# Patient Record
Sex: Male | Born: 1940 | Race: White | Hispanic: No | Marital: Married | Smoking: Former smoker
Health system: Southern US, Community
[De-identification: ages and names within clinical notes are randomized; demographics above are authoritative.]

## PROBLEM LIST (undated history)

## (undated) DIAGNOSIS — I219 Acute myocardial infarction, unspecified: Secondary | ICD-10-CM

## (undated) DIAGNOSIS — K219 Gastro-esophageal reflux disease without esophagitis: Secondary | ICD-10-CM

## (undated) DIAGNOSIS — IMO0002 Reserved for concepts with insufficient information to code with codable children: Secondary | ICD-10-CM

## (undated) DIAGNOSIS — Z87442 Personal history of urinary calculi: Secondary | ICD-10-CM

## (undated) DIAGNOSIS — J439 Emphysema, unspecified: Secondary | ICD-10-CM

## (undated) DIAGNOSIS — I350 Nonrheumatic aortic (valve) stenosis: Secondary | ICD-10-CM

## (undated) DIAGNOSIS — I2510 Atherosclerotic heart disease of native coronary artery without angina pectoris: Secondary | ICD-10-CM

## (undated) DIAGNOSIS — I10 Essential (primary) hypertension: Secondary | ICD-10-CM

## (undated) DIAGNOSIS — R9430 Abnormal result of cardiovascular function study, unspecified: Secondary | ICD-10-CM

## (undated) HISTORY — DX: Reserved for concepts with insufficient information to code with codable children: IMO0002

## (undated) HISTORY — PX: APPENDECTOMY: SHX54

## (undated) HISTORY — PX: CARDIAC CATHETERIZATION: SHX172

## (undated) HISTORY — DX: Atherosclerotic heart disease of native coronary artery without angina pectoris: I25.10

## (undated) HISTORY — PX: KNEE SURGERY: SHX244

## (undated) HISTORY — PX: TONSILLECTOMY: SUR1361

## (undated) HISTORY — DX: Essential (primary) hypertension: I10

## (undated) HISTORY — DX: Abnormal result of cardiovascular function study, unspecified: R94.30

## (undated) HISTORY — DX: Emphysema, unspecified: J43.9

## (undated) HISTORY — DX: Nonrheumatic aortic (valve) stenosis: I35.0

---

## 1999-01-29 ENCOUNTER — Encounter: Payer: Self-pay | Admitting: Emergency Medicine

## 1999-01-29 ENCOUNTER — Emergency Department (HOSPITAL_COMMUNITY): Admission: EM | Admit: 1999-01-29 | Discharge: 1999-01-29 | Payer: Self-pay | Admitting: Emergency Medicine

## 1999-01-30 ENCOUNTER — Encounter: Payer: Self-pay | Admitting: Urology

## 1999-01-30 ENCOUNTER — Ambulatory Visit (HOSPITAL_COMMUNITY): Admission: RE | Admit: 1999-01-30 | Discharge: 1999-01-30 | Payer: Self-pay | Admitting: Urology

## 1999-03-17 ENCOUNTER — Ambulatory Visit (HOSPITAL_COMMUNITY): Admission: RE | Admit: 1999-03-17 | Discharge: 1999-03-17 | Payer: Self-pay | Admitting: Urology

## 1999-03-17 ENCOUNTER — Encounter: Payer: Self-pay | Admitting: Urology

## 1999-03-25 ENCOUNTER — Encounter: Payer: Self-pay | Admitting: Urology

## 1999-03-25 ENCOUNTER — Ambulatory Visit (HOSPITAL_COMMUNITY): Admission: RE | Admit: 1999-03-25 | Discharge: 1999-03-25 | Payer: Self-pay | Admitting: Urology

## 1999-06-06 ENCOUNTER — Ambulatory Visit (HOSPITAL_COMMUNITY): Admission: RE | Admit: 1999-06-06 | Discharge: 1999-06-06 | Payer: Self-pay | Admitting: Urology

## 2000-03-25 ENCOUNTER — Emergency Department (HOSPITAL_COMMUNITY): Admission: EM | Admit: 2000-03-25 | Discharge: 2000-03-25 | Payer: Self-pay | Admitting: Emergency Medicine

## 2000-04-05 ENCOUNTER — Encounter: Payer: Self-pay | Admitting: Urology

## 2000-04-05 ENCOUNTER — Ambulatory Visit (HOSPITAL_COMMUNITY): Admission: RE | Admit: 2000-04-05 | Discharge: 2000-04-05 | Payer: Self-pay | Admitting: Urology

## 2000-04-16 ENCOUNTER — Ambulatory Visit (HOSPITAL_COMMUNITY): Admission: RE | Admit: 2000-04-16 | Discharge: 2000-04-16 | Payer: Self-pay | Admitting: Urology

## 2001-02-21 ENCOUNTER — Emergency Department (HOSPITAL_COMMUNITY): Admission: EM | Admit: 2001-02-21 | Discharge: 2001-02-22 | Payer: Self-pay | Admitting: Emergency Medicine

## 2003-12-06 ENCOUNTER — Ambulatory Visit (HOSPITAL_COMMUNITY): Admission: RE | Admit: 2003-12-06 | Discharge: 2003-12-06 | Payer: Self-pay | Admitting: Urology

## 2005-03-23 ENCOUNTER — Encounter: Payer: Self-pay | Admitting: Cardiology

## 2006-09-17 ENCOUNTER — Ambulatory Visit: Payer: Self-pay | Admitting: Unknown Physician Specialty

## 2007-01-03 ENCOUNTER — Encounter: Payer: Self-pay | Admitting: Cardiology

## 2007-01-21 ENCOUNTER — Encounter: Payer: Self-pay | Admitting: Cardiology

## 2007-02-01 ENCOUNTER — Encounter: Payer: Self-pay | Admitting: Cardiology

## 2007-02-18 ENCOUNTER — Encounter: Payer: Self-pay | Admitting: Cardiology

## 2008-02-08 ENCOUNTER — Encounter: Payer: Self-pay | Admitting: Cardiology

## 2008-06-15 ENCOUNTER — Encounter: Payer: Self-pay | Admitting: Cardiology

## 2008-10-12 ENCOUNTER — Encounter: Payer: Self-pay | Admitting: Cardiology

## 2008-11-22 ENCOUNTER — Ambulatory Visit: Payer: Self-pay | Admitting: Cardiology

## 2008-11-22 DIAGNOSIS — I359 Nonrheumatic aortic valve disorder, unspecified: Secondary | ICD-10-CM | POA: Insufficient documentation

## 2008-11-22 DIAGNOSIS — E669 Obesity, unspecified: Secondary | ICD-10-CM | POA: Insufficient documentation

## 2008-11-22 DIAGNOSIS — I10 Essential (primary) hypertension: Secondary | ICD-10-CM

## 2008-11-22 DIAGNOSIS — I5022 Chronic systolic (congestive) heart failure: Secondary | ICD-10-CM

## 2008-12-05 ENCOUNTER — Encounter: Payer: Self-pay | Admitting: Cardiology

## 2008-12-05 ENCOUNTER — Ambulatory Visit: Payer: Self-pay

## 2009-02-14 ENCOUNTER — Ambulatory Visit: Payer: Self-pay | Admitting: Cardiology

## 2009-03-04 ENCOUNTER — Ambulatory Visit: Payer: Self-pay | Admitting: Cardiology

## 2009-03-04 LAB — CONVERTED CEMR LAB
Basophils Absolute: 0 10*3/uL (ref 0.0–0.1)
Calcium: 9.5 mg/dL (ref 8.4–10.5)
Eosinophils Relative: 3.2 % (ref 0.0–5.0)
GFR calc non Af Amer: 71.11 mL/min (ref >60)
Glucose, Bld: 153 mg/dL — ABNORMAL HIGH (ref 70–99)
HCT: 37.3 % — ABNORMAL LOW (ref 39.0–52.0)
Hemoglobin: 13 g/dL (ref 13.0–17.0)
INR: 1 (ref 0.8–1.0)
Lymphocytes Relative: 36.2 % (ref 12.0–46.0)
Lymphs Abs: 2.7 10*3/uL (ref 0.7–4.0)
Monocytes Relative: 8.2 % (ref 3.0–12.0)
Neutro Abs: 4 10*3/uL (ref 1.4–7.7)
Platelets: 202 10*3/uL (ref 150.0–400.0)
Potassium: 4.2 meq/L (ref 3.5–5.1)
RDW: 12.7 % (ref 11.5–14.6)
Sodium: 142 meq/L (ref 135–145)
WBC: 7.5 10*3/uL (ref 4.5–10.5)
aPTT: 29.3 s — ABNORMAL HIGH (ref 21.7–28.8)

## 2009-03-07 ENCOUNTER — Ambulatory Visit: Payer: Self-pay | Admitting: Cardiology

## 2009-03-07 ENCOUNTER — Inpatient Hospital Stay (HOSPITAL_BASED_OUTPATIENT_CLINIC_OR_DEPARTMENT_OTHER): Admission: RE | Admit: 2009-03-07 | Discharge: 2009-03-07 | Payer: Self-pay | Admitting: Cardiology

## 2009-04-03 ENCOUNTER — Encounter: Payer: Self-pay | Admitting: Cardiology

## 2009-04-08 ENCOUNTER — Ambulatory Visit: Payer: Self-pay | Admitting: Cardiology

## 2009-05-28 ENCOUNTER — Ambulatory Visit: Payer: Self-pay | Admitting: Cardiology

## 2009-10-31 ENCOUNTER — Ambulatory Visit: Payer: Self-pay | Admitting: Cardiology

## 2010-02-20 ENCOUNTER — Ambulatory Visit: Payer: Self-pay | Admitting: Unknown Physician Specialty

## 2010-05-24 ENCOUNTER — Inpatient Hospital Stay (HOSPITAL_COMMUNITY)
Admission: EM | Admit: 2010-05-24 | Discharge: 2010-05-27 | Payer: Self-pay | Source: Home / Self Care | Attending: Internal Medicine | Admitting: Internal Medicine

## 2010-05-25 ENCOUNTER — Encounter: Payer: Self-pay | Admitting: Cardiology

## 2010-05-26 LAB — CARDIAC PANEL(CRET KIN+CKTOT+MB+TROPI)
CK, MB: 4.9 ng/mL — ABNORMAL HIGH (ref 0.3–4.0)
CK, MB: 6.9 ng/mL (ref 0.3–4.0)
CK, MB: 7.8 ng/mL (ref 0.3–4.0)
CK, MB: 8.5 ng/mL (ref 0.3–4.0)
Relative Index: INVALID (ref 0.0–2.5)
Relative Index: INVALID (ref 0.0–2.5)
Relative Index: 7.6 — ABNORMAL HIGH (ref 0.0–2.5)
Relative Index: INVALID (ref 0.0–2.5)
Total CK: 86 U/L (ref 7–232)
Total CK: 95 U/L (ref 7–232)
Total CK: 102 U/L (ref 7–232)
Total CK: 96 U/L (ref 7–232)
Troponin I: 0.24 ng/mL — ABNORMAL HIGH (ref 0.00–0.06)
Troponin I: 0.57 ng/mL (ref 0.00–0.06)
Troponin I: 1.55 ng/mL (ref 0.00–0.06)
Troponin I: 2 ng/mL (ref 0.00–0.06)

## 2010-05-26 LAB — HEMOGLOBIN A1C
Hgb A1c MFr Bld: 6.7 % — ABNORMAL HIGH (ref <5.7)
Mean Plasma Glucose: 146 mg/dL — ABNORMAL HIGH (ref <117)

## 2010-05-26 LAB — BASIC METABOLIC PANEL
BUN: 30 mg/dL — ABNORMAL HIGH (ref 6–23)
CO2: 25 mEq/L (ref 19–32)
Calcium: 10.1 mg/dL (ref 8.4–10.5)
Chloride: 101 mEq/L (ref 96–112)
Creatinine, Ser: 1.11 mg/dL (ref 0.4–1.5)
GFR calc Af Amer: >60 mL/min (ref >60)
GFR calc non Af Amer: >60 mL/min (ref >60)
Glucose, Bld: 263 mg/dL — ABNORMAL HIGH (ref 70–99)
Potassium: 4.5 mEq/L (ref 3.5–5.1)
Sodium: 138 mEq/L (ref 135–145)

## 2010-05-26 LAB — CBC
HCT: 38.9 % — ABNORMAL LOW (ref 39.0–52.0)
HCT: 37.8 % — ABNORMAL LOW (ref 39.0–52.0)
Hemoglobin: 12.8 g/dL — ABNORMAL LOW (ref 13.0–17.0)
Hemoglobin: 12.5 g/dL — ABNORMAL LOW (ref 13.0–17.0)
MCH: 30.2 pg (ref 26.0–34.0)
MCH: 30.4 pg (ref 26.0–34.0)
MCHC: 32.9 g/dL (ref 30.0–36.0)
MCHC: 33.1 g/dL (ref 30.0–36.0)
MCV: 91.7 fL (ref 78.0–100.0)
MCV: 92 fL (ref 78.0–100.0)
Platelets: 235 10*3/uL (ref 150–400)
Platelets: 229 10*3/uL (ref 150–400)
RBC: 4.24 MIL/uL (ref 4.22–5.81)
RBC: 4.11 MIL/uL — ABNORMAL LOW (ref 4.22–5.81)
RDW: 13 % (ref 11.5–15.5)
RDW: 12.9 % (ref 11.5–15.5)
WBC: 9.2 10*3/uL (ref 4.0–10.5)
WBC: 10.6 10*3/uL — ABNORMAL HIGH (ref 4.0–10.5)

## 2010-05-26 LAB — GLUCOSE, CAPILLARY
Glucose-Capillary: 234 mg/dL — ABNORMAL HIGH (ref 70–99)
Glucose-Capillary: 188 mg/dL — ABNORMAL HIGH (ref 70–99)
Glucose-Capillary: 168 mg/dL — ABNORMAL HIGH (ref 70–99)
Glucose-Capillary: 129 mg/dL — ABNORMAL HIGH (ref 70–99)
Glucose-Capillary: 142 mg/dL — ABNORMAL HIGH (ref 70–99)
Glucose-Capillary: 126 mg/dL — ABNORMAL HIGH (ref 70–99)
Glucose-Capillary: 186 mg/dL — ABNORMAL HIGH (ref 70–99)

## 2010-05-26 LAB — DIFFERENTIAL
Basophils Absolute: 0 10*3/uL (ref 0.0–0.1)
Basophils Relative: 0 % (ref 0–1)
Eosinophils Absolute: 0.1 10*3/uL (ref 0.0–0.7)
Eosinophils Relative: 1 % (ref 0–5)
Lymphocytes Relative: 20 % (ref 12–46)
Lymphs Abs: 1.9 10*3/uL (ref 0.7–4.0)
Monocytes Absolute: 0.6 10*3/uL (ref 0.1–1.0)
Monocytes Relative: 7 % (ref 3–12)
Neutro Abs: 6.6 10*3/uL (ref 1.7–7.7)
Neutrophils Relative %: 72 % (ref 43–77)

## 2010-05-26 LAB — CK TOTAL AND CKMB (NOT AT ARMC)
CK, MB: 2.7 ng/mL (ref 0.3–4.0)
Relative Index: INVALID (ref 0.0–2.5)
Total CK: 83 U/L (ref 7–232)

## 2010-05-26 LAB — COMPREHENSIVE METABOLIC PANEL
ALT: 33 U/L (ref 0–53)
AST: 38 U/L — ABNORMAL HIGH (ref 0–37)
Albumin: 3.3 g/dL — ABNORMAL LOW (ref 3.5–5.2)
Alkaline Phosphatase: 44 U/L (ref 39–117)
BUN: 22 mg/dL (ref 6–23)
CO2: 29 mEq/L (ref 19–32)
Calcium: 9.9 mg/dL (ref 8.4–10.5)
Chloride: 104 mEq/L (ref 96–112)
Creatinine, Ser: 1.05 mg/dL (ref 0.4–1.5)
GFR calc Af Amer: >60 mL/min (ref >60)
GFR calc non Af Amer: >60 mL/min (ref >60)
Glucose, Bld: 162 mg/dL — ABNORMAL HIGH (ref 70–99)
Potassium: 4.1 mEq/L (ref 3.5–5.1)
Sodium: 140 mEq/L (ref 135–145)
Total Bilirubin: 0.4 mg/dL (ref 0.3–1.2)
Total Protein: 6.4 g/dL (ref 6.0–8.3)

## 2010-05-26 LAB — TSH: TSH: 0.955 u[IU]/mL (ref 0.350–4.500)

## 2010-05-26 LAB — HEPARIN LEVEL (UNFRACTIONATED)
Heparin Unfractionated: <0.1 IU/mL — ABNORMAL LOW (ref 0.30–0.70)
Heparin Unfractionated: 0.16 IU/mL — ABNORMAL LOW (ref 0.30–0.70)

## 2010-05-26 LAB — POCT CARDIAC MARKERS
CKMB, poc: 3.1 ng/mL (ref 1.0–8.0)
Myoglobin, poc: 128 ng/mL (ref 12–200)
Troponin i, poc: <0.05 ng/mL (ref 0.00–0.09)

## 2010-05-26 LAB — LIPID PANEL
Cholesterol: 147 mg/dL (ref 0–200)
HDL: 29 mg/dL — ABNORMAL LOW (ref >39)
LDL Cholesterol: 74 mg/dL (ref 0–99)
Total CHOL/HDL Ratio: 5.1 RATIO
Triglycerides: 221 mg/dL — ABNORMAL HIGH (ref <150)
VLDL: 44 mg/dL — ABNORMAL HIGH (ref 0–40)

## 2010-05-26 LAB — TROPONIN I: Troponin I: 0.08 ng/mL — ABNORMAL HIGH (ref 0.00–0.06)

## 2010-05-26 LAB — PROTIME-INR
INR: 0.94 (ref 0.00–1.49)
Prothrombin Time: 12.8 seconds (ref 11.6–15.2)

## 2010-05-26 LAB — BRAIN NATRIURETIC PEPTIDE: Pro B Natriuretic peptide (BNP): 119 pg/mL — ABNORMAL HIGH (ref 0.0–100.0)

## 2010-05-26 LAB — D-DIMER, QUANTITATIVE: D-Dimer, Quant: 0.42 ug/mL-FEU (ref 0.00–0.48)

## 2010-05-27 ENCOUNTER — Telehealth: Payer: Self-pay | Admitting: Cardiology

## 2010-05-28 LAB — GLUCOSE, CAPILLARY
Glucose-Capillary: 171 mg/dL — ABNORMAL HIGH (ref 70–99)
Glucose-Capillary: 145 mg/dL — ABNORMAL HIGH (ref 70–99)
Glucose-Capillary: 132 mg/dL — ABNORMAL HIGH (ref 70–99)
Glucose-Capillary: 146 mg/dL — ABNORMAL HIGH (ref 70–99)
Glucose-Capillary: 167 mg/dL — ABNORMAL HIGH (ref 70–99)
Glucose-Capillary: 190 mg/dL — ABNORMAL HIGH (ref 70–99)
Glucose-Capillary: 152 mg/dL — ABNORMAL HIGH (ref 70–99)

## 2010-05-28 LAB — CBC
HCT: 36.7 % — ABNORMAL LOW (ref 39.0–52.0)
HCT: 36.9 % — ABNORMAL LOW (ref 39.0–52.0)
HCT: 36 % — ABNORMAL LOW (ref 39.0–52.0)
Hemoglobin: 12.1 g/dL — ABNORMAL LOW (ref 13.0–17.0)
Hemoglobin: 12.3 g/dL — ABNORMAL LOW (ref 13.0–17.0)
Hemoglobin: 11.8 g/dL — ABNORMAL LOW (ref 13.0–17.0)
MCH: 30 pg (ref 26.0–34.0)
MCH: 30.2 pg (ref 26.0–34.0)
MCH: 29.8 pg (ref 26.0–34.0)
MCHC: 33 g/dL (ref 30.0–36.0)
MCHC: 33.3 g/dL (ref 30.0–36.0)
MCHC: 32.8 g/dL (ref 30.0–36.0)
MCV: 90.7 fL (ref 78.0–100.0)
MCV: 90.8 fL (ref 78.0–100.0)
MCV: 90.9 fL (ref 78.0–100.0)
Platelets: 195 10*3/uL (ref 150–400)
Platelets: 210 10*3/uL (ref 150–400)
Platelets: 179 10*3/uL (ref 150–400)
RBC: 4.04 MIL/uL — ABNORMAL LOW (ref 4.22–5.81)
RBC: 4.07 MIL/uL — ABNORMAL LOW (ref 4.22–5.81)
RBC: 3.96 MIL/uL — ABNORMAL LOW (ref 4.22–5.81)
RDW: 12.9 % (ref 11.5–15.5)
RDW: 13 % (ref 11.5–15.5)
RDW: 12.9 % (ref 11.5–15.5)
WBC: 8.8 10*3/uL (ref 4.0–10.5)
WBC: 9.6 10*3/uL (ref 4.0–10.5)
WBC: 8.4 10*3/uL (ref 4.0–10.5)

## 2010-05-28 LAB — BASIC METABOLIC PANEL
BUN: 22 mg/dL (ref 6–23)
BUN: 19 mg/dL (ref 6–23)
CO2: 28 mEq/L (ref 19–32)
CO2: 29 mEq/L (ref 19–32)
Calcium: 9.7 mg/dL (ref 8.4–10.5)
Calcium: 9.6 mg/dL (ref 8.4–10.5)
Chloride: 101 mEq/L (ref 96–112)
Chloride: 104 mEq/L (ref 96–112)
Creatinine, Ser: 1.01 mg/dL (ref 0.4–1.5)
Creatinine, Ser: 1.19 mg/dL (ref 0.4–1.5)
GFR calc Af Amer: >60 mL/min (ref >60)
GFR calc Af Amer: >60 mL/min (ref >60)
GFR calc non Af Amer: >60 mL/min (ref >60)
GFR calc non Af Amer: >60 mL/min (ref >60)
Glucose, Bld: 180 mg/dL — ABNORMAL HIGH (ref 70–99)
Glucose, Bld: 154 mg/dL — ABNORMAL HIGH (ref 70–99)
Potassium: 3.8 mEq/L (ref 3.5–5.1)
Potassium: 4 mEq/L (ref 3.5–5.1)
Sodium: 136 mEq/L (ref 135–145)
Sodium: 138 mEq/L (ref 135–145)

## 2010-05-28 LAB — DIFFERENTIAL
Basophils Absolute: 0 10*3/uL (ref 0.0–0.1)
Basophils Absolute: 0 10*3/uL (ref 0.0–0.1)
Basophils Relative: 0 % (ref 0–1)
Basophils Relative: 0 % (ref 0–1)
Eosinophils Absolute: 0.3 10*3/uL (ref 0.0–0.7)
Eosinophils Absolute: 0.3 10*3/uL (ref 0.0–0.7)
Eosinophils Relative: 3 % (ref 0–5)
Eosinophils Relative: 3 % (ref 0–5)
Lymphocytes Relative: 43 % (ref 12–46)
Lymphocytes Relative: 34 % (ref 12–46)
Lymphs Abs: 4.1 10*3/uL — ABNORMAL HIGH (ref 0.7–4.0)
Lymphs Abs: 2.9 10*3/uL (ref 0.7–4.0)
Monocytes Absolute: 0.9 10*3/uL (ref 0.1–1.0)
Monocytes Absolute: 0.9 10*3/uL (ref 0.1–1.0)
Monocytes Relative: 9 % (ref 3–12)
Monocytes Relative: 11 % (ref 3–12)
Neutro Abs: 4.3 10*3/uL (ref 1.7–7.7)
Neutro Abs: 4.3 10*3/uL (ref 1.7–7.7)
Neutrophils Relative %: 45 % (ref 43–77)
Neutrophils Relative %: 52 % (ref 43–77)

## 2010-05-28 LAB — PHOSPHORUS
Phosphorus: 3.7 mg/dL (ref 2.3–4.6)
Phosphorus: 3.6 mg/dL (ref 2.3–4.6)

## 2010-05-28 LAB — HEPARIN LEVEL (UNFRACTIONATED): Heparin Unfractionated: 0.43 IU/mL (ref 0.30–0.70)

## 2010-05-28 LAB — MAGNESIUM
Magnesium: 2 mg/dL (ref 1.5–2.5)
Magnesium: 2.1 mg/dL (ref 1.5–2.5)

## 2010-06-04 NOTE — Discharge Summary (Addendum)
NAMEBAYAN, KUSHNIR               ACCOUNT NO.:  [id number]  MEDICAL RECORD NO.:  [id number]          PATIENT TYPE:  INP  LOCATION:  6531                         FACILITY:  [facility]  PHYSICIAN:  Rock Nephew, MD       DATE OF BIRTH:  06-26-1940  DATE OF ADMISSION:  05/24/2010 DATE OF DISCHARGE:  05/27/2010                        DISCHARGE SUMMARY - REFERRING   DISCHARGE DIAGNOSES: 1. Non-ST segment elevation myocardial infarction status post cardiac     catheterization with drug-eluting stent to the right coronary     artery. 2. Systolic congestive heart failure, chronic, ejection fraction of     35%. 3. Type 2 diabetes mellitus. 4. History of gastroesophageal reflux disease. 5. History of hyperlipidemia. 6. Cough and bronchitis. 7. Also, history of hypertension. 8. History of kidney stones. 9. History of gouty arthritis. 10.Dilated aortic root.  DISCHARGE MEDICATIONS: 1. Aspirin enteric-coated 325 mg p.o. daily. 2. Carvedilol 18.75 mg p.o. b.i.d. 3. Guaifenesin 600 mg p.o. b.i.d. 4. Effient 10 mg p.o. daily. 5. Allopurinol 300 mg p.o. daily. 6. Felodipine 10 mg p.o. daily. 7. Fenofibrate 160 mg p.o. daily. 8. Hydrocodone/chlorpheniramine oral suspension 1 teaspoon p.o. q.12h.     p.r.n. for cough. 9. Levofloxacin 500 mg, one tablet p.o. daily for three days. 10.Lisinopril/hydrochlorothiazide 20/12.5 mg p.o. daily. 11.Metformin XR 500 mg, take two tablets in the morning, one tablet in     the evening meal. 12.Omeprazole 40 mg p.o. daily. 13.Pravastatin 40 mg p.o. daily at bedtime. 14.Spironolactone 25 mg p.o. daily.  DISPOSITION:  The patient is discharged home.  DIET:  The patient's diet is carbohydrate-modified with a 2-liter fluid restriction.  FOLLOW-UP:  The patient should follow up with the primary care physician, Dr. Einar Crow, at [facility] in [place] in one to two weeks.  The patient should follow up with Dr. Rollene Rotunda on June 12, 2010 at 4:15 p.m.  CONSULTATIONS:  [facility], Dr. Rollene Rotunda as well as Dr. Tonny Bollman.  PROCEDURES PERFORMED DURING THIS ADMISSION:  The patient had a chest x- ray which showed bronchitic and questionable emphysematous changes.  No acute abnormalities.  The patient also had a 2-D echocardiogram which showed a left ventricle ejection fraction of 35%, moderate diffuse hypokinesias with some regionality.  The inferior wall and apical septal segments looked worse.  Doppler parameters were consistent with abnormal left ventricular relaxation, grade 1 diastolic dysfunction, aortic valve probably trileaflet, no stenosis, dilated aortic root measuring 46 mm.  The patient also had a cardiac catheterization which showed: 1. There was subtotal occlusion of the mid left circumflex treated     successfully with percutaneous coronary intervention with a 3 x 16     mm drug-eluting stent.  Percent stenosis was taken from 95% to 0%     with TIMI II flow taken to TIMI III flow post percutaneous coronary     intervention. 2. Moderate diffuse left anterior descending stenosis. 3. Diffuse nonobstructive right coronary artery stenosis.  The patient will remain on aspirin and Effient for greater than or equal to 12 months.  INITIAL HISTORY AND PHYSICAL/CHIEF COMPLAINT:  Chest pain.  HISTORY OF  PRESENT ILLNESS:  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  The patient reports he came in with chest pain.  He developed symptoms of upper respiratory infection about five days ago.  Then, the patient had chest pain and he came in to the hospital and the patient's troponins were elevated initially to around 0.2, but they reached a peak of about 2.  [facility] was consulted.  HOSPITAL COURSE: 1. NSTEMI.  The patient was developing an NSTEMI.  The patient was     placed on a  heparin drip.  The patient was seen by [facility].  The patient was taken to the Cardiac Cath Lab on     May 26, 2010 and the patient had the percutaneous coronary     intervention to the right coronary artery.  The patient remained     chest pain-free.  Dr. Antoine Poche saw the patient the next day.  He     recommended the patient stay on Effient and 325 mg of enteric-     coated aspirin for at least 12 months.  The patient should follow     up with Dr. Antoine Poche as an outpatient. 2. Systolic CHF, chronic.  The patient has systolic CHF, chronic.  It     is unclear if the patient has nonischemic or ischemic     cardiomyopathy.  Ischemic cardiomyopathy is possible since the     patient had coronary artery disease with stenosis.  The patient has     an ejection fraction of 35%.  The patient is discharged home on     both Aldactone as well as lisinopril.  The patient is also on     Coreg.  The Coreg dose has been increased to 18.75 mg b.i.d. 3. Type 2 diabetes mellitus.  The patient will be discharged home on     metformin as he takes at home. 4. GERD.  The patient takes a PPI.  He will continue that at home. 5. Hyperlipidemia.  The patient takes a statin as well as a fibrate     and he will continue that as an outpatient. 6. Cough, bronchitis.  The patient will receive Levaquin as well as     Mucinex for three more days. 7. Hypertension.  The patient takes multiple antihypertensives     including lisinopril/hydrochlorothiazide combination pill,     Aldactone, Coreg and felodipine. 8. DVT prophylaxis.  The patient initially received a heparin drip     then enoxaparin for DVT prophylaxis.  I spoke with Dr. Excell Seltzer on     May 27, 2010.  He told me that the patient should be okay for     discharge.  Please note that this is not an official document until electronically signed.     Rock Nephew, MD     NH/MEDQ  D:  05/27/2010  T:  05/27/2010  Job:  [id number]  cc:    [facility] Dr. Jarvis Morgan, MD, [facility]. Excell Seltzer, MD  Electronically Signed by Rock Nephew MD on 06/04/2010 06:25:17 PM

## 2010-06-05 NOTE — Consult Note (Addendum)
NAMEORLANDO, DEVEREUX NO.:  [id number]  MEDICAL RECORD NO.:  [id number]          PATIENT TYPE:  INP  LOCATION:  3708                         FACILITY:  [facility]  PHYSICIAN:  Rollene Rotunda, MD, FACCDATE OF BIRTH:  [birth year]  DATE OF CONSULTATION:  05/24/2010 DATE OF DISCHARGE:                                CONSULTATION   PRIMARY CARE PHYSICIAN:  Einar Crow.  CARDIOLOGIST:  Dr. Antoine Poche.  REASON FOR PRESENTATION:  Evaluate the patient with chest pain.  HISTORY OF PRESENT ILLNESS:  The patient is a pleasant 70 year old gentleman of mine.  He has a history of some nonobstructive coronary disease as described below.  He has had a mildly reduced ejection fraction in the past.  There has been a vague history of bicuspid aortic valve that I could not confirm on his most recent echo.  He had been getting along well until he developed what he thought were upper respiratory symptoms mid last week.  He saw his primary MD and was started on antibiotics and prednisone.  He said that on Wednesday following this he developed some shoulder and arm discomfort.  He had some radiation into his left shoulder blade.  He said he subsequently had radiation yesterday into his chest.  It was 8/10 in intensity.  It felt like an aching 18 discomfort.  It would last 3-4 minutes that time. He did get somewhat diaphoretic.  He did not have any nausea, vomiting or shortness of breath.  He did not have not any exacerbation with movement or deep breathing.  This was happening at rest.  He could not make it come on.  He never had this before.  He presented to the emergency room where he had no acute EKG changes.  CK was normal. However, troponin was very slightly elevated.  However, this evening it increased to 0.57.  His pain was not relieved with nitroglycerin in the emergency room but did go away with morphine.  He otherwise had been feeling well prior to what he felt was his  URI.  PAST HISTORY:  Hypertension, diabetes mellitus, cardiomyopathy (EF 35- 40% by echo 11/2008, 50% by catheterization), questionable bicuspid aortic valve without gradient (not clearly evident on the most recent echo), nonobstructive coronary disease (catheterization October 2010 with LAD 50% stenosis and scattered 25% lesions.)  PAST SURGICAL HISTORY:  Tonsillectomy, appendectomy, knee surgery in 1993 and 2006, cystourethroscopy.  FAMILY HISTORY:  Noncontributory for early coronary disease.  Father had the onset of coronary disease at a later age.  SOCIAL HISTORY:  The patient smoked 1 pack per day for 15 years quitting 30 years ago.  He is married with 1 child.  ALLERGIES:  None.  MEDICATIONS:  Hydrocodone, prednisone, levofloxacin, pravastatin 40 mg q.h.s., aspirin 81 mg daily, omeprazole 40 mg daily, felodipine 10 mg daily, allopurinol, lisinopril HCT 20/12.5, spironolactone 25 mg daily, fenofibrate 160 mg daily, carvedilol 12-1/2 mg b.i.d., metformin 500 mg b.i.d.  REVIEW OF SYSTEMS:  As stated in the HPI, otherwise negative for all other systems.  PHYSICAL EXAMINATION:  The patient is pleasant and in no distress. Blood pressure 156/81, heart rate  72 and regular, afebrile, respiratory rate 20, 98% saturation on room air. HEENT:  Eyelids are unremarkable, pupils equal, round and reactive to light, fundi not visualized, oral mucosa normal. NECK:  No jugular distention at 45 degrees.  Carotid upstroke brisk and symmetric, no bruits, no thyromegaly. LYMPHATICS:  No cervical, axillary or inguinal adenopathy. LUNGS:  Clear to auscultation bilaterally. BACK:  No costovertebral tenderness. CHEST:  Unremarkable. HEART:  PMI not displaced or sustained, S1-S2 within normal limits, no S3, S4, no clicks, rubs, murmurs. ABDOMEN:  Flat, positive bowel sounds that are normal frequency and pitch, no bruits, rebound, guarding or midline pulsatile mass, no hepatomegaly,  splenomegaly. SKIN:  No rashes, no nodules. EXTREMITIES:  2+ pulses, no edema, cyanosis or clubbing. NEURO:  Oriented to person place, and time, cranial nerves II-XII grossly intact, motor grossly intact.  EKG:  Sinus rhythm, left axis deviation, no acute ST-wave changes.  LABORATORY DATA:  TSH 0.955, hemoglobin A1c 6.7, CK peak 95, MB 6.9, troponin 0.57, BNP 119, D-dimer within normal limits, sodium 138, potassium 4.5, BUN 30, creatinine 1.11, WBC 9.2, hemoglobin 12.8, platelets 235.  Chest x-ray:  Questionable bronchitic changes.  ASSESSMENT/PLAN: 1. Elevated troponin.  The patient appears to have had non-Q-wave     myocardial infarction.  He will be switched from DVT Lovenox to     therapeutic heparin.  He will be continued on his aspirin and his     beta blocker.  We can use nitrates if he has any recurrent pain.     He will have elective cardiac catheterization on Monday. 2. Cardiomyopathy.  He has had a mildly reduced ejection fraction.  He     bas class I symptoms at this point.  I will repeat an     echocardiogram while he is here. 3. Diabetes per the primary team. 4. Risk reduction.  He should have a lipid profile with a goal LDL     less than 70 and HDL greater than 50.     Rollene Rotunda, MD, [facility]     JH/MEDQ  D:  05/24/2010  T:  05/24/2010  Job:  [id number]  Electronically Signed by Rollene Rotunda MD [facility] on 06/05/2010 12:29:23 PM

## 2010-06-10 NOTE — Assessment & Plan Note (Signed)
Summary: 1 MONTH ROV/   Visit Type:  Follow-up Primary Provider:  Einar Crow, MD  CC:  Cardiomyopathy.  History of Present Illness: The patient presents for followup of this. I titrated his carvedilol at the last appointment. He did well with this. He had no lightheadedness, presyncope or syncope. He's had no chest pain or shortness of breath. He's had no swelling or weight gain. He is up to walking 30 minutes most days of the week.  Current Medications (verified): 1)  Metformin Hcl 500 Mg Tabs (Metformin Hcl) .... 2 Qam and 1 Qpm 2)  Spironolactone 25 Mg Tabs (Spironolactone) .... Take One Tablet By Mouth Daily 3)  Felodipine 10 Mg Xr24h-Tab (Felodipine) .... Once Daily 4)  Omeprazole 40 Mg Cpdr (Omeprazole) .... Once Daily 5)  Allopurinol 300 Mg Tabs (Allopurinol) .... Once Daily 6)  Fenofibrate 160 Mg Tabs (Fenofibrate) .... Take One Tablet By Mouth Daily With A Meal 7)  Carvedilol 12.5 Mg Tabs (Carvedilol) .... Two Tabs Twice Daily 8)  Lisinopril-Hydrochlorothiazide 20-12.5 Mg Tabs (Lisinopril-Hydrochlorothiazide) .... Once Daily 9)  Aspirin 81 Mg Tbec (Aspirin) .... Take One Tablet By Mouth Daily 10)  Multivitamins   Tabs (Multiple Vitamin) .... Once Daily  Allergies (verified): No Known Drug Allergies  Past History:  Past Medical History: Hypertension x8 years Diabetes mellitus (recent diagnosis) Aortic stenosis (apparent bicuspid aortic valve) Mildly  to moderately reduced ejection fraction  (EF approximately 40%) Nonobstructive coronary disease  Past Surgical History: Reviewed history from 11/22/2008 and no changes required. Tonsillectomy Appendectomy Knee surgery in 1993 and in 2006  Review of Systems       As stated in the HPI and negative for all other systems.   Vital Signs:  Patient profile:   70 year old male Height:      74 inches Weight:      222 pounds BMI:     28.61 Pulse rate:   83 / minute BP sitting:   118 / 72  (left arm)  Vitals  Entered By: Oswald Hillock (May 28, 2009 4:32 PM)  Physical Exam  General:  Well developed, well nourished, in no acute distress. Head:  normocephalic and atraumatic Eyes:  PERRLA/EOM intact; conjunctiva and lids normal. Mouth:  Teeth, gums and palate normal. Oral mucosa normal. Neck:  Neck supple, no JVD. No masses, thyromegaly or abnormal cervical nodes. Chest Wall:  no deformities or breast masses noted Lungs:  Clear bilaterally to auscultation and percussion. Heart:  Non-displaced PMI, chest non-tender; regular rate and rhythm, S1, S2 without murmurs, rubs or gallops. Carotid upstroke normal, no bruit. Normal abdominal aortic size, no bruits. Femorals normal pulses, no bruits. Pedals normal pulses. No edema, no varicosities. Abdomen:  Bowel sounds positive; abdomen soft and non-tender without masses, organomegaly, or hernias noted. No hepatosplenomegaly, obese Msk:  Back normal, normal gait. Muscle strength and tone normal. Neurologic:  Alert and oriented x 3. Skin:  Intact without lesions or rashes. Psych:  Normal affect.   Impression & Recommendations:  Problem # 1:  CHRONIC SYSTOLIC HEART FAILURE (ICD-428.22) At this time I will not change his medical regimen. His blood pressure is running on the low side. He seems to have class I symptoms. I will see him back in several months to see if I want to make any meds hydration and followup with echocardiograms in the future.  Problem # 2:  OBESITY, UNSPECIFIED (ICD-278.00) I discussed with the patient again a diet strategies for weight loss.  Problem # 3:  ESSENTIAL  HYPERTENSION, BENIGN (ICD-401.1) His blood pressure is controlled on the meds as listed.  Patient Instructions: 1)  Your physician recommends that you schedule a follow-up appointment in: 4 months with Dr Antoine Poche 2)  Your physician recommends that you continue on your current medications as directed. Please refer to the Current Medication list given to you  today. 3)  You have been diagnosed with Congestive Heart Failure or CHF.  CHF is a condition in which a problem with the structure or function of the heart impairs its ability to supply sufficient blood flow to meet the body's needs.  For further information please visit www.cardiosmart.org for detailed information on CHF. 4)  Your physician recommends that you weigh, daily, at the same time every day, and in the same amount of clothing.  Please record your daily weights on the handout provided and bring it to your next appointment.

## 2010-06-10 NOTE — Assessment & Plan Note (Signed)
Summary: 4 MONTH ROV/SL   Visit Type:  Follow-up Primary Provider:  Einar Crow, MD   History of Present Illness: The patient presents for followup of his cardiomyopathy. He has a mildly reduced ejection fraction but no obstructive coronary disease on recent catheter. We're managing this medically. He's had no acute problems since I last saw him. He denies any new shortness of breath, PND or orthopnea. He denies any chest pressure, neck or arm discomfort. He does report fatigue especially after taking his medicines. He tries to walk early in the morning every day before his medications.  Current Medications (verified): 1)  Metformin Hcl 500 Mg Tabs (Metformin Hcl) .... 2 Qam and 1 Qpm 2)  Spironolactone 25 Mg Tabs (Spironolactone) .... Take One Tablet By Mouth Daily 3)  Felodipine 10 Mg Xr24h-Tab (Felodipine) .... Once Daily 4)  Omeprazole 40 Mg Cpdr (Omeprazole) .... Once Daily 5)  Allopurinol 300 Mg Tabs (Allopurinol) .... Once Daily 6)  Fenofibrate 160 Mg Tabs (Fenofibrate) .... Take One Tablet By Mouth Daily With A Meal 7)  Carvedilol 12.5 Mg Tabs (Carvedilol) .Marland Kitchen.. 1 By Mouth Two Times A Day 8)  Lisinopril-Hydrochlorothiazide 20-12.5 Mg Tabs (Lisinopril-Hydrochlorothiazide) .... Once Daily 9)  Aspirin 81 Mg Tbec (Aspirin) .... Take One Tablet By Mouth Daily 10)  Multivitamins   Tabs (Multiple Vitamin) .... Once Daily  Allergies (verified): No Known Drug Allergies  Past History:  Past Medical History: Reviewed history from 05/28/2009 and no changes required. Hypertension x8 years Diabetes mellitus (recent diagnosis) Aortic stenosis (apparent bicuspid aortic valve) Mildly  to moderately reduced ejection fraction  (EF approximately 40%) Nonobstructive coronary disease  Past Surgical History: Reviewed history from 11/22/2008 and no changes required. Tonsillectomy Appendectomy Knee surgery in 1993 and in 2006  Review of Systems       As stated in the HPI and negative  for all other systems.   Vital Signs:  Patient profile:   70 year old male Height:      74 inches Weight:      223 pounds BMI:     28.73 Pulse rate:   74 / minute Resp:     18 per minute BP sitting:   124 / 68  (right arm)  Vitals Entered By: Marrion Coy, CNA (October 31, 2009 9:52 AM)  Physical Exam  General:  Well developed, well nourished, in no acute distress. Head:  normocephalic and atraumatic Neck:  Neck supple, no JVD. No masses, thyromegaly or abnormal cervical nodes. Chest Wall:  no deformities or breast masses noted Lungs:  Clear bilaterally to auscultation and percussion. Heart:  Non-displaced PMI, chest non-tender; regular rate and rhythm, S1, S2 without murmurs, rubs or gallops. Carotid upstroke normal, no bruit. Normal abdominal aortic size, no bruits. Femorals normal pulses, no bruits. Pedals normal pulses. No edema, no varicosities. Abdomen:  Bowel sounds positive; abdomen soft and non-tender without masses, organomegaly, or hernias noted. No hepatosplenomegaly. Msk:  Back normal, normal gait. Muscle strength and tone normal. Extremities:  No clubbing or cyanosis. Neurologic:  Alert and oriented  Cervical Nodes:  no significant adenopathy Inguinal Nodes:  no significant adenopathy Psych:  Normal affect.   EKG  Procedure date:  10/31/2009  Findings:      Sinus rhythm, rate 74, left axis deviation, no acute ST-T wave changes  Impression & Recommendations:  Problem # 1:  ESSENTIAL HYPERTENSION, BENIGN (ICD-401.1) His blood pressure is controlled and he will continue the meds as listed.  I did suggest that he changed the  timing of the felodipine to nighttime to see if this helps with his daytime fatigue. Orders: EKG w/ Interpretation (93000)  Problem # 2:  OBESITY, UNSPECIFIED (ICD-278.00) He understands the need to lose weight with diet and exercise.  Problem # 3:  AORTIC VALVE DISORDERS (ICD-424.1) This is mild and will be followed  clinically. Orders: EKG w/ Interpretation (93000)  Patient Instructions: 1)  Your physician recommends that you schedule a follow-up appointment in: 12 months with Dr Antoine Poche 2)  Your physician recommends that you continue on your current medications as directed. Please refer to the Current Medication list given to you today.

## 2010-06-10 NOTE — Procedures (Signed)
[name], [name] NO.:  Burgess  MEDICAL RECORD NO.:  [id number]           PATIENT TYPE:  LOCATION:                                 FACILITY:  PHYSICIAN:  Veverly Fells. Excell Seltzer, MD  DATE OF BIRTH:  07/18/1940  DATE OF PROCEDURE:  05/26/2010 DATE OF DISCHARGE:                           CARDIAC CATHETERIZATION   PROCEDURE: 1. Selective coronary angiography. 2. PTCA and stenting of the left circumflex.  PROCEDURAL INDICATIONS:  [name] Burgess is a 70 year old diabetic gentleman with nonischemic cardiomyopathy.  He just underwent cardiac catheterization in 2010 demonstrating nonobstructive disease.  He presented with a non-ST-segment elevation MI with increasing cardiac markers and was referred for cardiac cath.  Risks and indications of the procedure were reviewed with the patient. Informed consent was obtained.  The right wrist was prepped, draped, anesthetized with 1% lidocaine.  Using the modified Seldinger technique, a 5-French sheath was placed in the right radial artery.  Unfractionated heparin 4000 units was given IV.  Verapamil 3 mg was administered through the sheath.  A TIG catheter was used for diagnostic angiography. Following the diagnostic angiogram, I elected to proceed with PCI of the left circumflex.  DIAGNOSTIC FINDINGS:  Aortic pressure is 100/72 with a mean of 86.  CORONARY ANGIOGRAPHY:  The coronary arteries have diffuse calcification.  The left mainstem is patent with mild calcification.  There are luminal irregularities but no significant stenoses present.  The left main divides into the LAD and left circumflex.  LAD:  The LAD has diffuse irregularity.  The proximal LAD has 30% to 40% stenosis.  The mid LAD after the second diagonal branch has an irregular 50% stenosis.  The entire vessel has irregularity but there are no areas of high-grade obstructive disease present.  The second diagonal is the largest branch of the LAD and has  mild ostial narrowing.  Left circumflex:  The left circumflex has minimal calcification.  The proximal vessel has 20% to 30% stenosis.  The first OM branch is patent, but just after the first OM branch there is a thrombotic lesion in the mid circumflex with 95% to 99% stenosis present.  There is TIMI 2 flow beyond the lesion.  The AV groove circumflex courses down and a second OM branch that fills late because of the more proximal stenosis.  Right coronary artery:  The right coronary artery is dominant.  It is a large vessel with diffuse irregularity throughout.  There are scattered 30% stenoses throughout the proximal and mid vessel.  The distal vessel has a 40% to 50% stenosis.  The PDA and posterolateral branches are patent.  PERCUTANEOUS CORONARY INTERVENTION NOTE:  After review of the films, I elected to proceed with PCI of the left circumflex.  This was clearly the patient's culprit vessel with subtotal occlusion and TIMI 2 flow. Heparin and Integrilin were utilized because of the heavy thrombus burden and the presence of type 2 diabetes.  Effient 60 mg was given to the patient on the table.  I had a great deal of difficulty fitting a guide catheter into the left coronary from the right wrist.  The  sheath had been upsized to a 6-French and an XB 3.5-cm guide catheter as well as a JL-3.5 centimeter guide catheter were attempted without success and Ikari 3.5-cm guide catheter was used and unsuccessful.  Finally, an Ikari 4.0-cm guide catheter was utilized successfully.  There was a wire placed in the ramus and a small ramus intermedius branch to stabilize the guide position.  A second wire was advanced beyond the area of stenosis into the second OM branch.  The vessel was then predilated with a 2.5- x 12-mm apex balloon which was taken to 8 atmospheres.  I attempted to stent the vessel with a 3.0- x 16-mm Promus drug-eluting stent, but the stent would not pass beyond the  midcircumflex.  Several attempts were made, but the guide support with suboptimal.  Ultimately, I advanced a 2.0- x 15-mm apex balloon simultaneously into the ramus intermedius branch and the balloon was inflated to 2 atmospheres.  This allowed the guide to be anchored and I was able to pass the stent beyond the area of stenosis in the AV groove circumflex.  The ramus wire and balloon were then removed and the stent was deployed at 16 atmospheres. It appeared well expanded.  The stent was then postdilated with a 3.25- x 12-mm Hallock Quantum apex balloon to 16 atmospheres over the proximal portion and 12 atmospheres over the distal portion.  The patient tolerated the entire procedure well.  There were no immediate complications.  There was an excellent angiographic result with 0% residual stenosis and TIMI 3 flow.  FINAL ASSESSMENT: 1. Subtotal occlusion of the mid left circumflex ,treated successfully     with percutaneous coronary intervention using a 3.0- x 16-mm drug-     eluting stent.  Percent stenosis was taken from 95-0 with TIMI 2     flow taken to TIMI 3 flow post percutaneous coronary intervention. 2. Moderate diffuse left anterior descending stenosis. 3. Diffuse nonobstructive right coronary artery stenosis.  RECOMMENDATIONS:  The patient will remain on aspirin and Effient greater than or equal to 12 months and will continue with medical therapy for his cardiomyopathy and residual CAD.     Veverly Fells. Excell Seltzer, MD     MDC/MEDQ  D:  05/26/2010  T:  05/27/2010  Job:  [id number]  cc:   Rollene Rotunda, MD, Mortimer Fries. Dareen Piano, MD  Electronically Signed by Tonny Bollman MD on 06/10/2010 04:52:25 AM

## 2010-06-10 NOTE — Consult Note (Signed)
Summary: [facility] Consult   Imported By: Roderic Ovens 01/23/2009 16:26:18  _____________________________________________________________________  External Attachment:    Type:   Image     Comment:   External Document

## 2010-06-11 ENCOUNTER — Encounter: Payer: Self-pay | Admitting: Cardiology

## 2010-06-12 ENCOUNTER — Encounter (INDEPENDENT_AMBULATORY_CARE_PROVIDER_SITE_OTHER): Payer: MEDICARE | Admitting: Cardiology

## 2010-06-12 ENCOUNTER — Ambulatory Visit: Admit: 2010-06-12 | Payer: Self-pay | Admitting: Cardiology

## 2010-06-12 ENCOUNTER — Encounter: Payer: Self-pay | Admitting: Cardiology

## 2010-06-12 DIAGNOSIS — I2510 Atherosclerotic heart disease of native coronary artery without angina pectoris: Secondary | ICD-10-CM | POA: Insufficient documentation

## 2010-06-12 NOTE — Progress Notes (Signed)
Summary: has question re nitro  [facility]  Phone Note Call from Patient   Caller: Patient [phone] Reason for Call: Talk to Nurse Summary of Call: pt calling re hospital dc-has questions re nitro-[facility] [address] Initial call taken by: Glynda Jaeger,  May 27, 2010 11:49 AM  Follow-up for Phone Call        RX sent in     New/Updated Medications: NITROSTAT 0.4 MG SUBL (NITROGLYCERIN) as directed for chest pain Prescriptions: NITROSTAT 0.4 MG SUBL (NITROGLYCERIN) as directed for chest pain  #25 x prn   Entered by:   Charolotte Capuchin, RN   Authorized by:   Rollene Rotunda, MD, [facility]   Signed by:   Charolotte Capuchin, RN on 05/27/2010   Method used:   [facility] ...       [facility] [address] [facility]             , [place]         Ph: [phone]       Fax: [phone]   RxID:   [id number]

## 2010-06-13 ENCOUNTER — Encounter: Payer: Self-pay | Admitting: Cardiology

## 2010-06-18 NOTE — Miscellaneous (Signed)
  Clinical Lists Changes  Observations: Added new observation of CARDCATHFIND: FINAL ASSESSMENT: 1. Subtotal occlusion of the mid left circumflex ,treated successfully     with percutaneous coronary intervention using a 3.0- x 16-mm drug-     eluting stent.  Percent stenosis was taken from 95-0 with TIMI 2     flow taken to TIMI 3 flow post percutaneous coronary intervention. 2. Moderate diffuse left anterior descending stenosis. 3. Diffuse nonobstructive right coronary artery stenosis.   RECOMMENDATIONS:  The patient will remain on aspirin and Effient greater than or equal to 12 months and will continue with medical therapy for his cardiomyopathy and residual CAD.   (05/27/2009 15:25)      Cardiac Cath  Procedure date:  05/27/2009  Findings:      FINAL ASSESSMENT: 1. Subtotal occlusion of the mid left circumflex ,treated successfully     with percutaneous coronary intervention using a 3.0- x 16-mm drug-     eluting stent.  Percent stenosis was taken from 95-0 with TIMI 2     flow taken to TIMI 3 flow post percutaneous coronary intervention. 2. Moderate diffuse left anterior descending stenosis. 3. Diffuse nonobstructive right coronary artery stenosis.   RECOMMENDATIONS:  The patient will remain on aspirin and Effient greater than or equal to 12 months and will continue with medical therapy for his cardiomyopathy and residual CAD.

## 2010-06-18 NOTE — Assessment & Plan Note (Signed)
Summary: eph per Dr. Jessica Seidman.gd/sp   Vital Signs:  Patient profile:   70 year old male Height:      74 inches Weight:      218.50 pounds BMI:     28.16 Pulse rate:   90 / minute Resp:     18 per minute BP sitting:   114 / 72  (left arm)  Vitals Entered By: Celestia Khat, CMA (June 12, 2010 4:06 PM)  Visit Type:  eph Primary Provider:  Einar Crow, MD  CC:  CAD.  History of Present Illness: The patient for followup of coronary disease status post recent DES stenting. Since this time he has done well. He has had no recurrent chest discomfort. He denies any neck or arm discomfort. He has had no shortness of breath, PND or orthopnea. He has had no palpitations, presyncope or syncope. He has been exercising daily.  Current Medications (verified): 1)  Metformin Hcl 500 Mg Tabs (Metformin Hcl) .... 2 Qam and 1 Qpm 2)  Spironolactone 25 Mg Tabs (Spironolactone) .... Take One Tablet By Mouth Daily 3)  Felodipine 10 Mg Xr24h-Tab (Felodipine) .... Once Daily 4)  Omeprazole 40 Mg Cpdr (Omeprazole) .... Once Daily 5)  Allopurinol 300 Mg Tabs (Allopurinol) .... Once Daily 6)  Fenofibrate 160 Mg Tabs (Fenofibrate) .... Take One Tablet By Mouth Daily With A Meal 7)  Carvedilol 12.5 Mg Tabs (Carvedilol) .Marland Kitchen.. 1 By Mouth Two Times A Day 8)  Lisinopril-Hydrochlorothiazide 20-12.5 Mg Tabs (Lisinopril-Hydrochlorothiazide) .... Once Daily 9)  Aspirin Ec 325 Mg Tbec (Aspirin) .... Take One Tablet By Mouth Daily 10)  Multivitamins   Tabs (Multiple Vitamin) .... Once Daily 11)  Nitrostat 0.4 Mg Subl (Nitroglycerin) .... As Directed For Chest Pain 12)  Effient 10 Mg Tabs (Prasugrel Hcl) .... Take One Tablet By Mouth Daily  Allergies (verified): No Known Drug Allergies  Past History:  Past Medical History: Hypertension x8 years Diabetes mellitus (recent diagnosis) Aortic stenosis (apparent bicuspid aortic valve) Mildly  to moderately reduced ejection fraction  (EF approximately  40%) CAD (Subtotal occlusion of the mid left circumflex ,treated successfully     with percutaneous coronary intervention using a 3.0- x 16-mm drug-     eluting stent.  Percent stenosis was taken from 95-0 with TIMI 2     flow taken to TIMI 3 flow post percutaneous coronary intervention.)  Past Surgical History: Reviewed history from 11/22/2008 and no changes required. Tonsillectomy Appendectomy Knee surgery in 1993 and in 2006  Review of Systems       As stated in the HPI and negative for all other systems.   Physical Exam  General:  Well developed, well nourished, in no acute distress. Head:  normocephalic and atraumatic Eyes:  PERRLA/EOM intact; conjunctiva and lids normal. Mouth:  Teeth, gums and palate normal. Oral mucosa normal. Neck:  Neck supple, no JVD. No masses, thyromegaly or abnormal cervical nodes. Chest Wall:  no deformities or breast masses noted Lungs:  Clear bilaterally to auscultation and percussion. Heart:  Non-displaced PMI, chest non-tender; regular rate and rhythm, S1, S2 without murmurs, rubs or gallops. Carotid upstroke normal, no bruit. Normal abdominal aortic size, no bruits. Femorals normal pulses, no bruits. Pedals normal pulses. No edema, no varicosities. Abdomen:  Bowel sounds positive; abdomen soft and non-tender without masses, organomegaly, or hernias noted. No hepatosplenomegaly. Msk:  Back normal, normal gait. Muscle strength and tone normal. Extremities:  No clubbing or cyanosis. Neurologic:  Alert and oriented  Skin:  Intact without  lesions or rashes. Cervical Nodes:  no significant adenopathy Inguinal Nodes:  no significant adenopathy Psych:  Normal affect.   Impression & Recommendations:  Problem # 1:  CAD (ICD-414.00) He has done well status post PCI. He may find it difficult to afford Effient.  If so we will need to switch to Plavix.  At this time no change in therapy is indicated.  Problem # 2:  ESSENTIAL HYPERTENSION, BENIGN  (ICD-401.1) His blood pressure is controlled and the meds will continue as listed.  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  He will continue with the meds as listed. Orders: EKG w/ Interpretation (93000)  Patient Instructions: 1)  Your physician recommends that you schedule a follow-up appointment in: 4 months with Dr Antoine Poche 2)  Your physician recommends that you continue on your current medications as directed. Please refer to the Current Medication list given to you today. Prescriptions: CARVEDILOL 12.5 MG TABS (CARVEDILOL) 1 by mouth two times a day  #180 x 3   Entered by:   Charolotte Capuchin, RN   Authorized by:   Rollene Rotunda, MD, [facility]   Signed by:   Charolotte Capuchin, RN on 06/12/2010   Method used:   [facility]  [address] [id number]       [address]       [place], [place]  [address]       Ph: [phone] or [phone]       Fax: [phone]   RxID:   [id number]    Orders Added: 1)  EKG w/ Interpretation [93000]

## 2010-07-08 NOTE — Letter (Signed)
Summary: [facility]hab Program  Cardiac Rehab Program   Imported By: Marylou Mccoy 07/03/2010 13:00:56  _____________________________________________________________________  External Attachment:    Type:   Image     Comment:   External Document

## 2010-09-26 NOTE — Op Note (Signed)
[facility]  Patient:    Mark Burgess, Mark Burgess                      MRN: [id number] Proc. Date: 04/16/00 Adm. Date:  [id number] Attending:  Nelma Rothman Iii                           Operative Report  PREOPERATIVE DIAGNOSIS: Left ureterolithiasis with pain.  POSTOPERATIVE DIAGNOSIS:  Left ureterolithiasis with pain.  OPERATION: 1. Cystourethroscopy. 2. Left retrograde ureteral pyelogram. 3. Left ureteroscopy with holmium laser lithotripsy. 4. Placement of left JJ stent.  SURGEON:  Lucrezia Starch. Ovidio Hanger, M.D.  ANESTHESIA:  General laryngeal airway.  ESTIMATED BLOOD LOSS: Negligible.  TUBES:  26 cm 6-French surgitek double pigtail stent.  COMPLICATIONS:  None.  INDICATIONS FOR PROCEDURE:  Mark Burgess is a very nice 70 year old white male who presented with left flank pain, nausea and vomiting.  He subsequently underwent an IVP which revealed delayed visualization on the left side and columning to an upper ureteral stone.  It was felt to be approximately 4 x 8 mm.  It was faintly calcified and difficult to see, and it was felt that ESL would be the best first choice.  He was placed on lithotripsy truck, and the stone could not be well visualized, so it was elected to cancel that case. He subsequently has considered options.  Although he has not been in severe pain, he has had intermittent discomfort and knows that the stone is there. He understands risks, benefits, and alternatives and would like to proceed with the above procedures.  PROCEDURE IN DETAIL:  The patient was placed in the supine position.  After proper general laryngeal airway anesthesia, he was placed in the dorsolithotomy position and prepped and draped with Betadine in a sterile fashion.  A cystourethroscopy was performed with a 22.5-French ACMI panendoscope.  Bladder was carefully inspected with 12 and 70 degree lenses. There was mild trilobar hypertrophy, grade 1 trabeculation,  and efflux of clear urine was noted from both ureteral orifices, and they were in normal location.  Under fluoroscopic guidance, a 0.038 French Teflon-coated guidewire was placed into the left renal pelvis.  A 6-French open-ended catheter wsa placed into the pelvis, and dye was injected.  The upper collecting system appeared to be somewhat dilated, and there was a hydronephrotic drip noted and some hydroureteronephrosis, although the stone really could not be seen well on fluoroscopy.  The wire was replaced.  The 6-French open-ended catheter was removed, and a short, thin ACMI ureteroscope was used, semirigid.  The orifice was entered, and approximately 4 cm above the orifice, a large yellowish stone was noted to be present corresponding with the previously known ureterolithiasis.  Utilizing a 365 laser fiber with the holmium laser, a setting of 5 joules and repetition rate of 5, the stone was broken into multiple fragments, and each fragment was grasped individually with a Nitinol basket and extracted intact.  Reinspection of the lower two-thirds ureter with the ureteroscope revealed no perforations and no significant fragments remaining.  The ureteroscope was visually removed.  Under fluoroscopic guidance, a 26 cm 6-French Surgitec double pigtail stent was placed in the left renal pelvis and into the bladder and noted to be in good position fluoroscopically.  The bladder was drained.  The panendoscope was removed, and a pull out string was taped to the penis.  The patient tolerated the procedure well  with no complications.  He was taken to the recovery room in stable condition.  The stone will be submitted for stone analysis. DD:  04/16/00 TD:  04/16/00 Job: [id number]

## 2010-10-16 ENCOUNTER — Telehealth: Payer: Self-pay | Admitting: Cardiology

## 2010-10-16 DIAGNOSIS — I2510 Atherosclerotic heart disease of native coronary artery without angina pectoris: Secondary | ICD-10-CM

## 2010-10-16 MED ORDER — PRASUGREL HCL 10 MG PO TABS: 10.0000 mg | ORAL_TABLET | Freq: Every day | ORAL | Status: DC

## 2010-10-16 NOTE — Telephone Encounter (Signed)
Pt calling re question re effient

## 2010-11-20 ENCOUNTER — Encounter: Payer: Self-pay | Admitting: Cardiology

## 2010-11-26 ENCOUNTER — Ambulatory Visit (INDEPENDENT_AMBULATORY_CARE_PROVIDER_SITE_OTHER): Payer: Medicare Other | Admitting: Cardiology

## 2010-11-26 ENCOUNTER — Encounter: Payer: Self-pay | Admitting: Cardiology

## 2010-11-26 DIAGNOSIS — E785 Hyperlipidemia, unspecified: Secondary | ICD-10-CM

## 2010-11-26 DIAGNOSIS — I5022 Chronic systolic (congestive) heart failure: Secondary | ICD-10-CM

## 2010-11-26 DIAGNOSIS — I10 Essential (primary) hypertension: Secondary | ICD-10-CM

## 2010-11-26 DIAGNOSIS — E669 Obesity, unspecified: Secondary | ICD-10-CM

## 2010-11-26 DIAGNOSIS — I359 Nonrheumatic aortic valve disorder, unspecified: Secondary | ICD-10-CM

## 2010-11-26 DIAGNOSIS — I2510 Atherosclerotic heart disease of native coronary artery without angina pectoris: Secondary | ICD-10-CM

## 2010-11-26 NOTE — Patient Instructions (Signed)
Please continue your medications as listed. Follow up with Dr Antoine Poche in 6 months

## 2010-11-26 NOTE — Assessment & Plan Note (Signed)
His EF is slightly reduced. He is euvolemic. I will follow this up with echocardiograms. He will continue meds as listed.

## 2010-11-26 NOTE — Progress Notes (Signed)
HPI The patient has been doing well since I last saw him. He has had no new cardiovascular complaints. He denies any chest pressure, neck or arm discomfort. He has had no palpitations, presyncope or syncope. He is exercising by walking 1/2 miles or 5 times per week without any discomfort. He has had no new shortness of breath, PND or orthopnea. He is tolerating medications as listed.  No Known Allergies  Current Outpatient Prescriptions  Medication Sig Dispense Refill  . allopurinol (ZYLOPRIM) 300 MG tablet Take 300 mg by mouth daily.        Marland Kitchen amLODipine (NORVASC) 5 MG tablet Take 5 mg by mouth daily.        Marland Kitchen aspirin 81 MG tablet Take 81 mg by mouth daily.        . carvedilol (COREG) 12.5 MG tablet Take 12.5 mg by mouth 2 (two) times daily.        . fenofibrate 160 MG tablet Take 160 mg by mouth daily. With a  Meal       . lisinopril-hydrochlorothiazide (PRINZIDE,ZESTORETIC) 20-12.5 MG per tablet Take 1 tablet by mouth daily.        . metFORMIN (GLUCOPHAGE) 500 MG tablet Take 500 mg by mouth 2 (two) times daily with a meal.       . nitroGLYCERIN (NITROSTAT) 0.4 MG SL tablet Place 0.4 mg under the tongue every 5 (five) minutes as needed.        Marland Kitchen omeprazole (PRILOSEC) 40 MG capsule Take 40 mg by mouth daily.        . prasugrel (EFFIENT) 10 MG TABS Take 1 tablet (10 mg total) by mouth daily.  30 tablet  6  . pravastatin (PRAVACHOL) 40 MG tablet Take 20 mg by mouth daily.        Marland Kitchen spironolactone (ALDACTONE) 25 MG tablet Take 25 mg by mouth daily.          Past Medical History  Diagnosis Date  . HTN (hypertension)     x8 years  . Diabetes mellitus     recent diagnosis  . Aortic stenosis     apparent bicuspid aortic valve  . Ejection fraction < 50%     mildly to moderately reduced EF. 40%  . CAD (coronary artery disease)     subtotal occlusion of mid left circumflex, treated successfully w percutaneous coronary intervention using a 3.0- x 16-mm drug eluting stent. Percent stenois was  taken from 95-0 w TIMI 2 flow taken to TIMI 3 flow post percutaneous coronary intervention    Past Surgical History  Procedure Date  . Tonsillectomy   . Appendectomy   . Knee surgery 1993, 2006     ROS:  As stated in the HPI and negative for all other systems.  PHYSICAL EXAM BP 112/78  Pulse 75  Resp 16  Ht 6\' 2"  (1.88 m)  Wt 214 lb (97.07 kg)  BMI 27.48 kg/m2 GENERAL:  Well appearing HEENT:  Pupils equal round and reactive, fundi not visualized, oral mucosa unremarkable NECK:  No jugular venous distention, waveform within normal limits, carotid upstroke brisk and symmetric, no bruits, no thyromegaly LYMPHATICS:  No cervical, inguinal adenopathy LUNGS:  Clear to auscultation bilaterally BACK:  No CVA tenderness CHEST:  Unremarkable HEART:  PMI not displaced or sustained,S1 and S2 within normal limits, no S3, no S4, no clicks, no rubs, apical systolic murmurs out the outflow tract and early peaking 2/6 ABD:  Flat, positive bowel sounds normal in frequency in pitch,  no bruits, no rebound, no guarding, no midline pulsatile mass, no hepatomegaly, no splenomegaly, umbilical hernia. EXT:  2 plus pulses throughout, no edema, no cyanosis no clubbing SKIN:  No rashes no nodules NEURO:  Cranial nerves II through XII grossly intact, motor grossly intact throughout PSYCH:  Cognitively intact, oriented to person place and time   EKG:  Sinus rhythm, rate 75, borderline interventricular conduction delay, no acute ST-T wave changes.  ASSESSMENT AND PLAN

## 2010-11-26 NOTE — Assessment & Plan Note (Signed)
The blood pressure is at target. No change in medications is indicated. We will continue with therapeutic lifestyle changes (TLC).  

## 2010-11-26 NOTE — Assessment & Plan Note (Signed)
This is moderate and we will follow this clinically.

## 2010-11-26 NOTE — Assessment & Plan Note (Signed)
I reviewed labs done 7/5. The LDL was 90.6. The HDL is 29.6. This is cardiac or LDL but not quite her HDL. He is already on combination therapy. I would advise continued exercise. No change in therapy is indicated.

## 2010-11-26 NOTE — Assessment & Plan Note (Signed)
The patient has no new sypmtoms.  No further cardiovascular testing is indicated.  We will continue with aggressive risk reduction and meds as listed.  

## 2010-11-27 NOTE — Progress Notes (Signed)
Addended by: Marrion Coy L on: 11/27/2010 03:41 PM   Modules accepted: Orders

## 2010-12-03 ENCOUNTER — Encounter: Payer: Self-pay | Admitting: Cardiology

## 2011-02-17 ENCOUNTER — Telehealth: Payer: Self-pay | Admitting: Cardiology

## 2011-02-17 MED ORDER — NITROGLYCERIN 0.4 MG SL SUBL: 0.4000 mg | SUBLINGUAL_TABLET | SUBLINGUAL | Status: DC | PRN

## 2011-02-17 NOTE — Telephone Encounter (Signed)
Pt would like to get co-pay card for effient

## 2011-02-17 NOTE — Telephone Encounter (Signed)
Refill NTG sent in to pt pharmacy at his request. Pt will stop by today for Effient copay card. Card placed at front desk for pt. Mylo Red RN  .

## 2011-05-17 ENCOUNTER — Other Ambulatory Visit: Payer: Self-pay | Admitting: Cardiology

## 2011-06-22 ENCOUNTER — Telehealth: Payer: Self-pay | Admitting: Cardiology

## 2011-06-22 ENCOUNTER — Other Ambulatory Visit: Payer: Self-pay | Admitting: Cardiology

## 2011-06-22 MED ORDER — CARVEDILOL 12.5 MG PO TABS: 12.5000 mg | ORAL_TABLET | Freq: Two times a day (BID) | ORAL | Status: AC

## 2011-06-22 NOTE — Telephone Encounter (Signed)
New Problem:     Patient's wife called in because her husband has suddenly turned pale, fatigued, shaky, a little nauseous and sweaty and she wanted to know what she should do. Please call back.

## 2011-06-22 NOTE — Telephone Encounter (Signed)
Per wife - pt was on the screened in porch, moving some things around and became dizzy.  He also complained of feeling weak and shaky.  His BP is 123/67 and HR 66, BS 124.  His grips are equal, tongue it midline when sticking it out, no weakness on one side and no slurred speech.  Pt states the dizziness do not occur when going from sitting to standing but rather when he moves his head from side to side.  Wife will continue to monitor tonight and call PCP in am if needed.  If s/s worsen she will take him to the nearest ED.

## 2011-07-07 ENCOUNTER — Ambulatory Visit (INDEPENDENT_AMBULATORY_CARE_PROVIDER_SITE_OTHER): Payer: Medicare Other | Admitting: Cardiology

## 2011-07-07 ENCOUNTER — Encounter: Payer: Self-pay | Admitting: Cardiology

## 2011-07-07 VITALS — BP 110/70 | HR 79 | Ht 74.0 in | Wt 219.0 lb

## 2011-07-07 DIAGNOSIS — I359 Nonrheumatic aortic valve disorder, unspecified: Secondary | ICD-10-CM

## 2011-07-07 DIAGNOSIS — R42 Dizziness and giddiness: Secondary | ICD-10-CM

## 2011-07-07 DIAGNOSIS — I5022 Chronic systolic (congestive) heart failure: Secondary | ICD-10-CM

## 2011-07-07 DIAGNOSIS — I2510 Atherosclerotic heart disease of native coronary artery without angina pectoris: Secondary | ICD-10-CM

## 2011-07-07 NOTE — Assessment & Plan Note (Signed)
He seems to be euvolemic.  I will follow up an echo as described.

## 2011-07-07 NOTE — Assessment & Plan Note (Signed)
The patient has no new sypmtoms.  No further cardiovascular testing is indicated.  We will continue with aggressive risk reduction and meds as listed.  

## 2011-07-07 NOTE — Patient Instructions (Signed)
Your physician has requested that you have an echocardiogram. Echocardiography is a painless test that uses sound waves to create images of your heart. It provides your doctor with information about the size and shape of your heart and how well your heart's chambers and valves are working. This procedure takes approximately one hour. There are no restrictions for this procedure.  The current medical regimen is effective;  continue present plan and medications.  Follow up with Dr Antoine Poche in 3 months

## 2011-07-07 NOTE — Progress Notes (Signed)
HPI The patient called two weeks ago to discuss an episode if dizziness.  This happened while he was minimally exerting himself moving furniture.  He became dizzy and diaphoretic. He checked his blood pressure is normal. Blood sugar was slightly high. He did not feel palpitations. He did not have visual or voice disturbance. He did not have any chest pressure, neck or arm discomfort. He wasn't nauseated. He did not have frank syncope. One day following this he did have fatigue but none of these other symptoms.  He has since had none of the presyncope. He denies any chest pressure, neck or arm discomfort. He denies any palpitations. He's had no new shortness of breath, PND or orthopnea. He has had no weight gain or edema.   No Known Allergies  Current Outpatient Prescriptions  Medication Sig Dispense Refill  . allopurinol (ZYLOPRIM) 300 MG tablet Take 300 mg by mouth daily.        Marland Kitchen amLODipine (NORVASC) 5 MG tablet Take 5 mg by mouth daily.        Marland Kitchen aspirin 81 MG tablet Take 81 mg by mouth daily.        . carvedilol (COREG) 12.5 MG tablet Take 1 tablet (12.5 mg total) by mouth 2 (two) times daily.  60 tablet  5  . EFFIENT 10 MG TABS TAKE 1 TABLET (10 MG TOTAL) BY MOUTH DAILY.  30 tablet  6  . fenofibrate 160 MG tablet Take 160 mg by mouth daily. With a  Meal       . lisinopril-hydrochlorothiazide (PRINZIDE,ZESTORETIC) 20-12.5 MG per tablet Take 1 tablet by mouth daily.        . metFORMIN (GLUCOPHAGE) 500 MG tablet Take 500 mg by mouth daily with breakfast.       . nitroGLYCERIN (NITROSTAT) 0.4 MG SL tablet Place 1 tablet (0.4 mg total) under the tongue every 5 (five) minutes as needed.  25 tablet  6  . omeprazole (PRILOSEC) 40 MG capsule Take 40 mg by mouth daily.        . pravastatin (PRAVACHOL) 40 MG tablet Take 20 mg by mouth daily.        Marland Kitchen spironolactone (ALDACTONE) 25 MG tablet Take 25 mg by mouth daily.          Past Medical History  Diagnosis Date  . HTN (hypertension)     x8 years   . Diabetes mellitus     recent diagnosis  . Aortic stenosis     apparent bicuspid aortic valve  . Ejection fraction < 50%     mildly to moderately reduced EF. 40%  . CAD (coronary artery disease)     subtotal occlusion of mid left circumflex, treated successfully w percutaneous coronary intervention using a 3.0- x 16-mm drug eluting stent. Percent stenois was taken from 95-0 w TIMI 2 flow taken to TIMI 3 flow post percutaneous coronary intervention    Past Surgical History  Procedure Date  . Tonsillectomy   . Appendectomy   . Knee surgery 1993, 2006     ROS:  As stated in the HPI and negative for all other systems.  PHYSICAL EXAM BP 110/70  Pulse 79  Ht 6\' 2"  (1.88 m)  Wt 219 lb (99.338 kg)  BMI 28.12 kg/m2 GENERAL:  Well appearing HEENT:  Pupils equal round and reactive, fundi not visualized, oral mucosa unremarkable NECK:  No jugular venous distention, waveform within normal limits, carotid upstroke brisk and symmetric, no bruits, no thyromegaly LYMPHATICS:  No  cervical, inguinal adenopathy LUNGS:  Clear to auscultation bilaterally BACK:  No CVA tenderness CHEST:  Unremarkable HEART:  PMI not displaced or sustained,S1 and S2 within normal limits, no S3, no S4, no clicks, no rubs, apical systolic murmurs out the outflow tract and early peaking 2/6 ABD:  Flat, positive bowel sounds normal in frequency in pitch, no bruits, no rebound, no guarding, no midline pulsatile mass, no hepatomegaly, no splenomegaly, umbilical hernia. EXT:  2 plus pulses throughout, no edema, no cyanosis no clubbing SKIN:  No rashes no nodules NEURO:  Cranial nerves II through XII grossly intact, motor grossly intact throughout PSYCH:  Cognitively intact, oriented to person place and time   EKG:  Sinus rhythm, rate 79, borderline interventricular conduction delay, LAD, no acute ST-T wave changes.  07/07/2011  ASSESSMENT AND PLAN

## 2011-07-07 NOTE — Assessment & Plan Note (Signed)
The etiology this is unclear. I will start with  and echocardiogram to further assess ejection fraction which has been mildly reduced in the past. I will have a low threshold for event monitoring if he has any recurrent symptoms. However, with one episode I would not be inclined to do this and let's his ejection fraction is lower than previous. At that point I might also consider ICD.

## 2011-07-07 NOTE — Assessment & Plan Note (Signed)
There has been a questionable bicuspid valve previously but the last echo suggested the tricuspid valve with no significant stenosis. This will be followed up as above.

## 2011-07-16 ENCOUNTER — Ambulatory Visit (HOSPITAL_COMMUNITY): Payer: Medicare Other | Attending: Cardiology

## 2011-07-16 ENCOUNTER — Other Ambulatory Visit: Payer: Self-pay

## 2011-07-16 DIAGNOSIS — I359 Nonrheumatic aortic valve disorder, unspecified: Secondary | ICD-10-CM | POA: Insufficient documentation

## 2011-07-16 DIAGNOSIS — I10 Essential (primary) hypertension: Secondary | ICD-10-CM | POA: Insufficient documentation

## 2011-07-16 DIAGNOSIS — R42 Dizziness and giddiness: Secondary | ICD-10-CM | POA: Insufficient documentation

## 2011-07-16 DIAGNOSIS — I4510 Unspecified right bundle-branch block: Secondary | ICD-10-CM | POA: Insufficient documentation

## 2011-09-15 ENCOUNTER — Encounter: Payer: Self-pay | Admitting: Cardiology

## 2011-09-15 ENCOUNTER — Ambulatory Visit (INDEPENDENT_AMBULATORY_CARE_PROVIDER_SITE_OTHER): Payer: Medicare Other | Admitting: Cardiology

## 2011-09-15 VITALS — BP 120/85 | HR 75 | Ht 74.0 in | Wt 223.8 lb

## 2011-09-15 DIAGNOSIS — I5022 Chronic systolic (congestive) heart failure: Secondary | ICD-10-CM

## 2011-09-15 DIAGNOSIS — E785 Hyperlipidemia, unspecified: Secondary | ICD-10-CM

## 2011-09-15 DIAGNOSIS — I10 Essential (primary) hypertension: Secondary | ICD-10-CM

## 2011-09-15 DIAGNOSIS — E669 Obesity, unspecified: Secondary | ICD-10-CM

## 2011-09-15 DIAGNOSIS — I2510 Atherosclerotic heart disease of native coronary artery without angina pectoris: Secondary | ICD-10-CM

## 2011-09-15 NOTE — Assessment & Plan Note (Signed)
He will call me with his last lipid profile with a goal LDL less than 100.

## 2011-09-15 NOTE — Progress Notes (Signed)
HPI The patient presents for yearly follow up.  Since I last saw him he has done well.  The patient denies any new symptoms such as chest discomfort, neck or arm discomfort. There has been no new shortness of breath, PND or orthopnea. There have been no reported palpitations, presyncope or syncope.  He has had none of the dizziness that he was having.   No Known Allergies  Current Outpatient Prescriptions  Medication Sig Dispense Refill  . allopurinol (ZYLOPRIM) 300 MG tablet Take 300 mg by mouth daily.        Marland Kitchen amLODipine (NORVASC) 5 MG tablet Take 5 mg by mouth daily.        Marland Kitchen aspirin 81 MG tablet Take 81 mg by mouth daily.        . carvedilol (COREG) 12.5 MG tablet Take 1 tablet (12.5 mg total) by mouth 2 (two) times daily.  60 tablet  5  . EFFIENT 10 MG TABS TAKE 1 TABLET (10 MG TOTAL) BY MOUTH DAILY.  30 tablet  6  . fenofibrate 160 MG tablet Take 160 mg by mouth daily. With a  Meal       . lisinopril-hydrochlorothiazide (PRINZIDE,ZESTORETIC) 20-12.5 MG per tablet Take 1 tablet by mouth daily.        . metFORMIN (GLUCOPHAGE) 500 MG tablet Take 500 mg by mouth daily with breakfast.       . nitroGLYCERIN (NITROSTAT) 0.4 MG SL tablet Place 1 tablet (0.4 mg total) under the tongue every 5 (five) minutes as needed.  25 tablet  6  . omeprazole (PRILOSEC) 40 MG capsule Take 40 mg by mouth daily.        . pravastatin (PRAVACHOL) 20 MG tablet Take 20 mg by mouth daily.      Marland Kitchen spironolactone (ALDACTONE) 25 MG tablet Take 25 mg by mouth daily.          Past Medical History  Diagnosis Date  . HTN (hypertension)     x8 years  . Diabetes mellitus     recent diagnosis  . Aortic stenosis     apparent bicuspid aortic valve  . Ejection fraction < 50%     mildly to moderately reduced EF. 40%  . CAD (coronary artery disease)     subtotal occlusion of mid left circumflex, treated successfully w percutaneous coronary intervention using a 3.0- x 16-mm drug eluting stent. Percent stenois was taken  from 95-0 w TIMI 2 flow taken to TIMI 3 flow post percutaneous coronary intervention    Past Surgical History  Procedure Date  . Tonsillectomy   . Appendectomy   . Knee surgery 1993, 2006     ROS:  As stated in the HPI and negative for all other systems.  PHYSICAL EXAM BP 120/85  Pulse 75  Ht 6\' 2"  (1.88 m)  Wt 223 lb 12.8 oz (101.515 kg)  BMI 28.73 kg/m2 GENERAL:  Well appearing HEENT:  Pupils equal round and reactive, fundi not visualized, oral mucosa unremarkable NECK:  No jugular venous distention, waveform within normal limits, carotid upstroke brisk and symmetric, no bruits, no thyromegaly LYMPHATICS:  No cervical, inguinal adenopathy LUNGS:  Clear to auscultation bilaterally BACK:  No CVA tenderness CHEST:  Unremarkable HEART:  PMI not displaced or sustained,S1 and S2 within normal limits, no S3, no S4, no clicks, no rubs, apical systolic murmurs out the outflow tract and early peaking 2/6 ABD:  Flat, positive bowel sounds normal in frequency in pitch, no bruits, no rebound, no  guarding, no midline pulsatile mass, no hepatomegaly, no splenomegaly, umbilical hernia. EXT:  2 plus pulses throughout, no edema, no cyanosis no clubbing SKIN:  No rashes no nodules NEURO:  Cranial nerves II through XII grossly intact, motor grossly intact throughout PSYCH:  Cognitively intact, oriented to person place and time  EKG:  Sinus rhythm, rate 79, borderline interventricular conduction delay, LAD, no acute ST-T wave changes.  09/15/2011  ASSESSMENT AND PLAN

## 2011-09-15 NOTE — Assessment & Plan Note (Signed)
The blood pressure is at target. No change in medications is indicated. We will continue with therapeutic lifestyle changes (TLC).  

## 2011-09-15 NOTE — Patient Instructions (Signed)
The current medical regimen is effective;  continue present plan and medications.  Follow up in 1 year with Dr Hochrein.  You will receive a letter in the mail 2 months before you are due.  Please call us when you receive this letter to schedule your follow up appointment.  

## 2011-09-15 NOTE — Assessment & Plan Note (Signed)
The patient understands the need to lose weight with diet and exercise. We have discussed specific strategies for this.  

## 2011-09-15 NOTE — Assessment & Plan Note (Signed)
His EF earlier this year was mildly reduced and unchanged.  No change in therapy is indicated.

## 2011-09-15 NOTE — Assessment & Plan Note (Addendum)
The patient has no new sypmtoms.  No further cardiovascular testing is indicated.  We will continue with aggressive risk reduction and meds as listed.  He can stop his Effient.

## 2011-09-16 ENCOUNTER — Telehealth: Payer: Self-pay | Admitting: Cardiology

## 2011-09-16 NOTE — Telephone Encounter (Signed)
Per pt spouse pt LDL was 31.0 and HDL was 96.8 and this was as of 07/15/11

## 2011-09-16 NOTE — Telephone Encounter (Signed)
Will forward to Dr Hochrein for review  

## 2011-09-23 NOTE — Telephone Encounter (Signed)
This is an acceptable lipid profile.

## 2011-09-24 ENCOUNTER — Telehealth: Payer: Self-pay | Admitting: Cardiology

## 2011-09-24 NOTE — Telephone Encounter (Signed)
Labs OK.  Continue current therapy.

## 2011-09-24 NOTE — Telephone Encounter (Signed)
Left message that this is acceptable and no changes are needed.  Requested she call back if questions,.

## 2011-09-24 NOTE — Telephone Encounter (Signed)
New msg Pt's wife called about blood work he had done at another physician's office. Please call her back

## 2011-09-24 NOTE — Telephone Encounter (Signed)
Per wife - pt's HDL was 31.0 and LDL was 96.8 on 07/15/2011.  Will inform Dr Antoine Poche

## 2012-06-01 ENCOUNTER — Other Ambulatory Visit: Payer: Self-pay | Admitting: Orthopedic Surgery

## 2012-06-01 DIAGNOSIS — M25551 Pain in right hip: Secondary | ICD-10-CM

## 2012-06-03 ENCOUNTER — Ambulatory Visit
Admission: RE | Admit: 2012-06-03 | Discharge: 2012-06-03 | Disposition: A | Discharge status: Home / Self Care | Payer: Medicare Other | Source: Ambulatory Visit | Attending: Orthopedic Surgery | Admitting: Orthopedic Surgery

## 2012-06-03 ENCOUNTER — Other Ambulatory Visit: Payer: Medicare Other

## 2012-06-03 DIAGNOSIS — M25551 Pain in right hip: Secondary | ICD-10-CM

## 2012-06-03 MED ORDER — IOHEXOL 300 MG/ML  SOLN
100.0000 mL | Freq: Once | INTRAMUSCULAR | Status: AC | PRN
  Administered 2012-06-03: 100 mL via INTRAVENOUS

## 2012-09-01 ENCOUNTER — Emergency Department (HOSPITAL_COMMUNITY)
Admission: EM | Admit: 2012-09-01 | Discharge: 2012-09-01 | Disposition: A | Discharge status: Home / Self Care | Payer: Medicare Other | Attending: Emergency Medicine | Admitting: Emergency Medicine

## 2012-09-01 ENCOUNTER — Encounter (HOSPITAL_COMMUNITY): Payer: Self-pay | Admitting: Emergency Medicine

## 2012-09-01 DIAGNOSIS — N138 Other obstructive and reflux uropathy: Secondary | ICD-10-CM | POA: Insufficient documentation

## 2012-09-01 DIAGNOSIS — Z87891 Personal history of nicotine dependence: Secondary | ICD-10-CM | POA: Insufficient documentation

## 2012-09-01 DIAGNOSIS — I10 Essential (primary) hypertension: Secondary | ICD-10-CM | POA: Insufficient documentation

## 2012-09-01 DIAGNOSIS — N400 Enlarged prostate without lower urinary tract symptoms: Secondary | ICD-10-CM

## 2012-09-01 DIAGNOSIS — Z8679 Personal history of other diseases of the circulatory system: Secondary | ICD-10-CM | POA: Insufficient documentation

## 2012-09-01 DIAGNOSIS — I2510 Atherosclerotic heart disease of native coronary artery without angina pectoris: Secondary | ICD-10-CM | POA: Insufficient documentation

## 2012-09-01 DIAGNOSIS — Z79899 Other long term (current) drug therapy: Secondary | ICD-10-CM | POA: Insufficient documentation

## 2012-09-01 DIAGNOSIS — Z7982 Long term (current) use of aspirin: Secondary | ICD-10-CM | POA: Insufficient documentation

## 2012-09-01 DIAGNOSIS — R339 Retention of urine, unspecified: Secondary | ICD-10-CM | POA: Insufficient documentation

## 2012-09-01 DIAGNOSIS — N401 Enlarged prostate with lower urinary tract symptoms: Secondary | ICD-10-CM | POA: Insufficient documentation

## 2012-09-01 DIAGNOSIS — E119 Type 2 diabetes mellitus without complications: Secondary | ICD-10-CM | POA: Insufficient documentation

## 2012-09-01 DIAGNOSIS — Z9089 Acquired absence of other organs: Secondary | ICD-10-CM | POA: Insufficient documentation

## 2012-09-01 LAB — URINALYSIS, ROUTINE W REFLEX MICROSCOPIC
Bilirubin Urine: NEGATIVE
Glucose, UA: NEGATIVE mg/dL
Ketones, ur: NEGATIVE mg/dL
Leukocytes, UA: NEGATIVE
Nitrite: NEGATIVE
Protein, ur: NEGATIVE mg/dL
Specific Gravity, Urine: 1.016 (ref 1.005–1.030)
Urobilinogen, UA: 0.2 mg/dL (ref 0.0–1.0)
pH: 6 (ref 5.0–8.0)

## 2012-09-01 LAB — URINE MICROSCOPIC-ADD ON

## 2012-09-01 NOTE — ED Provider Notes (Signed)
History     CSN: [id number]  Arrival date & time 09/01/12  0545   First MD Initiated Contact with Patient 09/01/12 0557      Chief Complaint  Patient presents with  . Urinary Retention    HPI Mark Burgess is a 72 y.o. male with a history of BPH was recently started on Flomax by Dr. Lynnae Sandhoff recently returned from a flight from [facility] and has had inability to urinate since about 11:00 last night. Patient is having severe lower abdominal pain feels like pressure, feels like he needs to urinate but he simply cannot. Her no alleviating or exacerbating factors no other associated symptoms. Patient has no fevers, chills, chest pain, shortness of breath, vomiting, diarrhea.   Past Medical History  Diagnosis Date  . HTN (hypertension)     x8 years  . Diabetes mellitus     recent diagnosis  . Aortic stenosis     apparent bicuspid aortic valve  . Ejection fraction < 50%     mildly to moderately reduced EF. 40%  . CAD (coronary artery disease)     subtotal occlusion of mid left circumflex, treated successfully w percutaneous coronary intervention using a 3.0- x 16-mm drug eluting stent. Percent stenois was taken from 95-0 w TIMI 2 flow taken to TIMI 3 flow post percutaneous coronary intervention    Past Surgical History  Procedure Laterality Date  . Tonsillectomy    . Appendectomy    . Knee surgery  1993, 2006    Family History  Problem Relation Age of Onset  . Heart disease Father     History  Substance Use Topics  . Smoking status: Former Smoker    Quit date: 09/14/1981  . Smokeless tobacco: Not on file     Comment: 1 ppd x 15 years, quit 30 years ago   . Alcohol Use: Yes     Comment: occ      Review of Systems At least 10pt or greater review of systems completed and are negative except where specified in the HPI.  Allergies  Review of patient's allergies indicates no known allergies.  Home Medications   Current Outpatient Rx  Name  Route  Sig  Dispense   Refill  . allopurinol (ZYLOPRIM) 300 MG tablet   Oral   Take 300 mg by mouth daily.           Marland Kitchen amLODipine (NORVASC) 5 MG tablet   Oral   Take 5 mg by mouth daily.           Marland Kitchen aspirin 81 MG tablet   Oral   Take 81 mg by mouth daily.           . carvedilol (COREG) 12.5 MG tablet   Oral   Take 1 tablet (12.5 mg total) by mouth 2 (two) times daily.   60 tablet   5   . fenofibrate 160 MG tablet   Oral   Take 160 mg by mouth daily. With a  Meal          . lisinopril-hydrochlorothiazide (PRINZIDE,ZESTORETIC) 20-12.5 MG per tablet   Oral   Take 1 tablet by mouth daily.           . metFORMIN (GLUCOPHAGE) 500 MG tablet   Oral   Take 500 mg by mouth daily with breakfast.          . nitroGLYCERIN (NITROSTAT) 0.4 MG SL tablet   Sublingual   Place 1 tablet (0.4 mg total)  under the tongue every 5 (five) minutes as needed.   25 tablet   6   . omeprazole (PRILOSEC) 40 MG capsule   Oral   Take 40 mg by mouth daily.           . pravastatin (PRAVACHOL) 20 MG tablet   Oral   Take 20 mg by mouth daily.         Marland Kitchen spironolactone (ALDACTONE) 25 MG tablet   Oral   Take 25 mg by mouth daily.             BP 163/82  Pulse 84  Temp(Src) 97.9 F (36.6 C) (Oral)  Resp 22  SpO2 95%  Physical Exam  Nursing notes reviewed.  Electronic medical record reviewed. VITAL SIGNS:   Filed Vitals:   09/01/12 0553 09/01/12 0747  BP: 163/82 140/77  Pulse: 84 73  Temp: 97.9 F (36.6 C) 98.6 F (37 C)  TempSrc: Oral   Resp: 22 18  SpO2: 95% 97%   CONSTITUTIONAL: Awake, oriented, appears non-toxic HENT: Atraumatic, normocephalic, oral mucosa pink and moist, airway patent. Nares patent without drainage. External ears normal. EYES: Conjunctiva clear, EOMI, PERRLA NECK: Trachea midline, non-tender, supple CARDIOVASCULAR: Normal heart rate, Normal rhythm, No murmurs, rubs, gallops PULMONARY/CHEST: Clear to auscultation, no rhonchi, wheezes, or rales. Symmetrical breath  sounds. Non-tender. ABDOMINAL: Soft, tenderness to palpation in the suprapubic region with some mild protuberance of the lower abdomen.  BS normal. NEUROLOGIC: Non-focal, moving all four extremities, no gross sensory or motor deficits. EXTREMITIES: No clubbing, cyanosis, or edema SKIN: Warm, Dry, No erythema, No rash   ED Course  [place] bedside Date/Time: 09/02/2012 6:00 AM Performed by: Jones Skene Authorized by: Jones Skene Consent: Verbal consent obtained. Comments: Bladder scan said 96 mL was retained in the bladder on my ultrasound, there is about 1700 mL in his bladder.   (including critical care time)  Labs Reviewed  URINALYSIS, ROUTINE W REFLEX MICROSCOPIC - Abnormal; Notable for the following:    Hgb urine dipstick LARGE (*)    All other components within normal limits  URINE MICROSCOPIC-ADD ON   No results found.   1. Urinary retention   2. Prostatic hyperplasia       MDM  Bladder scan are likely inaccurate, patient clinically looks like he is suffering from urinary retention and bedside ultrasound shows about 1700 mL of retained fluid.  Foley catheter placed, large amount of urine out, but will leave Foley catheter in until the patient can follow up with urologist next week. Urinalysis is unremarkable. Patient is given catheter care instructions as well as instructions return for symptoms of [place] to infection including fevers, chills, confusion, worsening pain or any other concerning symptoms.  Pt and wife understand medical plan agree with it and have had their questions answered.           Jones Skene, MD 09/03/12 [phone]

## 2012-09-01 NOTE — ED Notes (Signed)
Pt states he is unable to void, last time voided was 2300 and states was very little then, pt states in pain d/t "feeling full". Pt states on a new medication to help shrink his prostate and help urine flow better. Bladder scanner done, showed 94 ml.

## 2012-09-01 NOTE — ED Notes (Signed)
Pt c/o urinary retention onset 2300 last night, recently started meds for urinary flow.

## 2012-09-01 NOTE — ED Notes (Signed)
Bag given with instructions pt voiced understanding

## 2013-01-26 ENCOUNTER — Ambulatory Visit: Payer: Medicare Other | Admitting: Cardiology

## 2013-02-09 ENCOUNTER — Encounter: Payer: Self-pay | Admitting: Cardiology

## 2013-02-09 ENCOUNTER — Ambulatory Visit (INDEPENDENT_AMBULATORY_CARE_PROVIDER_SITE_OTHER): Payer: Medicare Other | Admitting: Cardiology

## 2013-02-09 VITALS — BP 126/72 | HR 80 | Ht 74.0 in | Wt 223.0 lb

## 2013-02-09 DIAGNOSIS — I5022 Chronic systolic (congestive) heart failure: Secondary | ICD-10-CM

## 2013-02-09 DIAGNOSIS — I2510 Atherosclerotic heart disease of native coronary artery without angina pectoris: Secondary | ICD-10-CM

## 2013-02-09 NOTE — Progress Notes (Signed)
HPI The patient presents for yearly follow up.  Since I last saw him he has done well.  The patient denies any new symptoms such as chest discomfort, neck or arm discomfort. There has been no new shortness of breath, PND or orthopnea. There have been no reported palpitations, presyncope or syncope.  He he unfortunately doesn't exercise because of pain. However, with his yard work he doesn't develop any cardiovascular symptoms.   No Known Allergies  Current Outpatient Prescriptions  Medication Sig Dispense Refill  . allopurinol (ZYLOPRIM) 300 MG tablet Take 300 mg by mouth daily.        Marland Kitchen amLODipine (NORVASC) 5 MG tablet Take 5 mg by mouth daily.        Marland Kitchen aspirin EC 81 MG tablet Take 81 mg by mouth daily.      . carvedilol (COREG) 12.5 MG tablet Take 1 tablet (12.5 mg total) by mouth 2 (two) times daily.  60 tablet  5  . fenofibrate 160 MG tablet Take 160 mg by mouth daily. With a  Meal       . finasteride (PROSCAR) 5 MG tablet Take 5 mg by mouth daily.      Marland Kitchen lisinopril-hydrochlorothiazide (PRINZIDE,ZESTORETIC) 20-12.5 MG per tablet Take 1 tablet by mouth daily.        . metFORMIN (GLUCOPHAGE) 500 MG tablet Take 500 mg by mouth daily with breakfast.       . nitroGLYCERIN (NITROSTAT) 0.4 MG SL tablet Place 1 tablet (0.4 mg total) under the tongue every 5 (five) minutes as needed.  25 tablet  6  . omeprazole (PRILOSEC) 40 MG capsule Take 40 mg by mouth daily.        . pravastatin (PRAVACHOL) 20 MG tablet Take 20 mg by mouth daily.      Marland Kitchen spironolactone (ALDACTONE) 25 MG tablet Take 25 mg by mouth daily.        . tamsulosin (FLOMAX) 0.4 MG CAPS capsule Take 0.4 mg by mouth.       No current facility-administered medications for this visit.    Past Medical History  Diagnosis Date  . HTN (hypertension)     x8 years  . Diabetes mellitus     recent diagnosis  . Aortic stenosis     apparent bicuspid aortic valve  . Ejection fraction < 50%     mildly to moderately reduced EF. 40%  . CAD  (coronary artery disease)     subtotal occlusion of mid left circumflex, treated successfully w percutaneous coronary intervention using a 3.0- x 16-mm drug eluting stent. Percent stenois was taken from 95-0 w TIMI 2 flow taken to TIMI 3 flow post percutaneous coronary intervention    Past Surgical History  Procedure Laterality Date  . Tonsillectomy    . Appendectomy    . Knee surgery  1993, 2006     ROS:  As stated in the HPI and negative for all other systems.  PHYSICAL EXAM BP 126/72  Pulse 80  Ht 6\' 2"  (1.88 m)  Wt 223 lb (101.152 kg)  BMI 28.62 kg/m2  SpO2 97% GENERAL:  Well appearing NECK:  No jugular venous distention, waveform within normal limits, carotid upstroke brisk and symmetric, no bruits, no thyromegaly LUNGS:  Clear to auscultation bilaterally BACK:  No CVA tenderness CHEST:  Unremarkable HEART:  PMI not displaced or sustained,S1 and S2 within normal limits, no S3, no S4, no clicks, no rubs, apical systolic murmurs out the outflow tract and early peaking 2/6  ABD:  Flat, positive bowel sounds normal in frequency in pitch, no bruits, no rebound, no guarding, no midline pulsatile mass, no hepatomegaly, no splenomegaly, umbilical hernia. EXT:  2 plus pulses throughout, no edema, no cyanosis no clubbing  EKG:  Sinus rhythm, rate 80, borderline interventricular conduction delay, LAD, no acute ST-T wave changes.  02/09/2013  ASSESSMENT AND PLAN  CAD: The patient has no new sypmtoms.  No further cardiovascular testing is indicated.  We will continue with aggressive risk reduction and meds as listed.  HTN:  The blood pressure is at target. No change in medications is indicated. We will continue with therapeutic lifestyle changes (TLC).  HYPERLIPIDEMIA:  I will defer to Lauro Regulus., MD  SYSTOLIC HEART FAILURE:  He seems to be euvolemic.  At this point, no change in therapy is indicated.  We have reviewed salt and fluid restrictions.  No further cardiovascular  testing is indicated. I do not see another indication for an echocardiogram.   OBESITY:  The patient understands the need to lose weight with diet and exercise. We have discussed specific strategies for this.

## 2013-02-09 NOTE — Patient Instructions (Addendum)
The current medical regimen is effective;  continue present plan and medications.  Follow up in 1 year with Dr Hochrein.  You will receive a letter in the mail 2 months before you are due.  Please call us when you receive this letter to schedule your follow up appointment.  

## 2013-03-13 ENCOUNTER — Encounter: Payer: Self-pay | Admitting: Cardiology

## 2014-07-30 ENCOUNTER — Ambulatory Visit: Payer: Self-pay | Admitting: Internal Medicine

## 2015-03-27 ENCOUNTER — Encounter
Admission: RE | Disposition: A | Discharge status: Home / Self Care | Payer: Self-pay | Source: Ambulatory Visit | Attending: Unknown Physician Specialty

## 2015-03-27 ENCOUNTER — Encounter: Payer: Self-pay | Admitting: Anesthesiology

## 2015-03-27 ENCOUNTER — Ambulatory Visit: Payer: Medicare Other | Admitting: Anesthesiology

## 2015-03-27 ENCOUNTER — Ambulatory Visit
Admission: RE | Admit: 2015-03-27 | Discharge: 2015-03-27 | Disposition: A | Discharge status: Home / Self Care | Payer: Medicare Other | Source: Ambulatory Visit | Attending: Unknown Physician Specialty | Admitting: Unknown Physician Specialty

## 2015-03-27 DIAGNOSIS — D125 Benign neoplasm of sigmoid colon: Secondary | ICD-10-CM | POA: Insufficient documentation

## 2015-03-27 DIAGNOSIS — I2510 Atherosclerotic heart disease of native coronary artery without angina pectoris: Secondary | ICD-10-CM | POA: Insufficient documentation

## 2015-03-27 DIAGNOSIS — N529 Male erectile dysfunction, unspecified: Secondary | ICD-10-CM | POA: Diagnosis not present

## 2015-03-27 DIAGNOSIS — Q231 Congenital insufficiency of aortic valve: Secondary | ICD-10-CM | POA: Insufficient documentation

## 2015-03-27 DIAGNOSIS — Z7982 Long term (current) use of aspirin: Secondary | ICD-10-CM | POA: Insufficient documentation

## 2015-03-27 DIAGNOSIS — I350 Nonrheumatic aortic (valve) stenosis: Secondary | ICD-10-CM | POA: Diagnosis not present

## 2015-03-27 DIAGNOSIS — Z79899 Other long term (current) drug therapy: Secondary | ICD-10-CM | POA: Insufficient documentation

## 2015-03-27 DIAGNOSIS — D123 Benign neoplasm of transverse colon: Secondary | ICD-10-CM | POA: Diagnosis not present

## 2015-03-27 DIAGNOSIS — K5730 Diverticulosis of large intestine without perforation or abscess without bleeding: Secondary | ICD-10-CM | POA: Insufficient documentation

## 2015-03-27 DIAGNOSIS — I10 Essential (primary) hypertension: Secondary | ICD-10-CM | POA: Diagnosis not present

## 2015-03-27 DIAGNOSIS — Z7984 Long term (current) use of oral hypoglycemic drugs: Secondary | ICD-10-CM | POA: Insufficient documentation

## 2015-03-27 DIAGNOSIS — E119 Type 2 diabetes mellitus without complications: Secondary | ICD-10-CM | POA: Diagnosis not present

## 2015-03-27 DIAGNOSIS — K640 First degree hemorrhoids: Secondary | ICD-10-CM | POA: Insufficient documentation

## 2015-03-27 DIAGNOSIS — Z955 Presence of coronary angioplasty implant and graft: Secondary | ICD-10-CM | POA: Insufficient documentation

## 2015-03-27 DIAGNOSIS — Z86010 Personal history of colonic polyps: Secondary | ICD-10-CM | POA: Diagnosis present

## 2015-03-27 DIAGNOSIS — Z87891 Personal history of nicotine dependence: Secondary | ICD-10-CM | POA: Insufficient documentation

## 2015-03-27 DIAGNOSIS — K621 Rectal polyp: Secondary | ICD-10-CM | POA: Insufficient documentation

## 2015-03-27 HISTORY — PX: COLONOSCOPY WITH PROPOFOL: SHX5780

## 2015-03-27 LAB — GLUCOSE, CAPILLARY: Glucose-Capillary: 161 mg/dL — ABNORMAL HIGH (ref 65–99)

## 2015-03-27 SURGERY — COLONOSCOPY WITH PROPOFOL
Anesthesia: General

## 2015-03-27 MED ORDER — PHENYLEPHRINE HCL 10 MG/ML IJ SOLN
INTRAMUSCULAR | Status: DC | PRN
  Administered 2015-03-27 (×6): 100 ug via INTRAVENOUS

## 2015-03-27 MED ORDER — PROPOFOL 500 MG/50ML IV EMUL
INTRAVENOUS | Status: DC | PRN
  Administered 2015-03-27: 120 ug/kg/min via INTRAVENOUS

## 2015-03-27 MED ORDER — SODIUM CHLORIDE 0.9 % IV SOLN
INTRAVENOUS | Status: DC
  Administered 2015-03-27: 1000 mL via INTRAVENOUS

## 2015-03-27 MED ORDER — SODIUM CHLORIDE 0.9 % IV SOLN: INTRAVENOUS | Status: DC

## 2015-03-27 MED ORDER — PROPOFOL 10 MG/ML IV BOLUS
INTRAVENOUS | Status: DC | PRN
  Administered 2015-03-27 (×2): 50 mg via INTRAVENOUS

## 2015-03-27 NOTE — Transfer of Care (Signed)
Immediate Anesthesia Transfer of Care Note  Patient: Mark Burgess  Procedure(s) Performed: Procedure(s): COLONOSCOPY WITH PROPOFOL (N/A)  Patient Location: PACU  Anesthesia Type:General  Level of Consciousness: awake and alert   Airway & Oxygen Therapy: Patient Spontanous Breathing and Patient connected to nasal cannula oxygen  Post-op Assessment: Report given to RN and Post -op Vital signs reviewed and stable  Post vital signs: Reviewed and stable  Last Vitals:  Filed Vitals:   03/27/15 0828  BP: 139/77  Pulse: 81  Temp: 36.4 C  Resp: 16    Complications: No apparent anesthesia complications

## 2015-03-27 NOTE — Anesthesia Preprocedure Evaluation (Signed)
Anesthesia Evaluation  Patient identified by MRN, date of birth, ID band Patient awake    Reviewed: Allergy & Precautions, NPO status , Patient's Chart, lab work & pertinent test results, reviewed documented beta blocker date and time   Airway Mallampati: III  TM Distance: >3 FB Neck ROM: Full    Dental  (+) Partial Lower   Pulmonary former smoker,    Pulmonary exam normal breath sounds clear to auscultation       Cardiovascular hypertension, Pt. on medications and Pt. on home beta blockers + CAD  Normal cardiovascular exam+ Valvular Problems/Murmurs   Bicuspid aortic valve   Neuro/Psych negative neurological ROS  negative psych ROS   GI/Hepatic Neg liver ROS, GERD  Medicated and Controlled,  Endo/Other  diabetes, Well Controlled, Type 2, Oral Hypoglycemic Agents  Renal/GU negative Renal ROS  negative genitourinary   Musculoskeletal   Abdominal   Peds negative pediatric ROS (+)  Hematology negative hematology ROS (+)   Anesthesia Other Findings   Reproductive/Obstetrics                             Anesthesia Physical Anesthesia Plan  ASA: III  Anesthesia Plan: General   Post-op Pain Management:    Induction: Intravenous  Airway Management Planned:   Additional Equipment:   Intra-op Plan:   Post-operative Plan:   Informed Consent: I have reviewed the patients History and Physical, chart, labs and discussed the procedure including the risks, benefits and alternatives for the proposed anesthesia with the patient or authorized representative who has indicated his/her understanding and acceptance.   Dental advisory given  Plan Discussed with: CRNA and Surgeon  Anesthesia Plan Comments:         Anesthesia Quick Evaluation

## 2015-03-27 NOTE — H&P (Signed)
Primary Care Physician:  Kirk Ruths., MD Primary Gastroenterologist:  Dr. Vira Agar  Pre-Procedure History & Physical: HPI:  Mark Burgess is a 74 y.o. male is here for an colonoscopy.   Past Medical History  Diagnosis Date  . HTN (hypertension)     x8 years  . Diabetes mellitus     recent diagnosis  . Aortic stenosis     apparent bicuspid aortic valve  . Ejection fraction < 50%     mildly to moderately reduced EF. 40%  . CAD (coronary artery disease)     subtotal occlusion of mid left circumflex, treated successfully w percutaneous coronary intervention using a 3.0- x 16-mm drug eluting stent. Percent stenois was taken from 95-0 w TIMI 2 flow taken to TIMI 3 flow post percutaneous coronary intervention    Past Surgical History  Procedure Laterality Date  . Tonsillectomy    . Appendectomy    . Knee surgery  1993, 2006    Prior to Admission medications   Medication Sig Start Date End Date Taking? Authorizing Provider  allopurinol (ZYLOPRIM) 300 MG tablet Take 300 mg by mouth daily.     Yes Historical Provider, MD  amLODipine (NORVASC) 5 MG tablet Take 5 mg by mouth daily.     Yes Historical Provider, MD  aspirin EC 81 MG tablet Take 81 mg by mouth daily.   Yes Historical Provider, MD  atorvastatin (LIPITOR) 80 MG tablet Take 80 mg by mouth daily.   Yes Historical Provider, MD  carvedilol (COREG) 12.5 MG tablet Take 1 tablet (12.5 mg total) by mouth 2 (two) times daily. 06/22/11  Yes Minus Breeding, MD  fenofibrate 160 MG tablet Take 160 mg by mouth daily. With a  Meal    Yes Historical Provider, MD  finasteride (PROSCAR) 5 MG tablet Take 5 mg by mouth daily.   Yes Historical Provider, MD  lisinopril-hydrochlorothiazide (PRINZIDE,ZESTORETIC) 20-12.5 MG per tablet Take 1 tablet by mouth daily.     Yes Historical Provider, MD  metFORMIN (GLUCOPHAGE) 500 MG tablet Take 500 mg by mouth daily with breakfast.    Yes Historical Provider, MD  nitroGLYCERIN (NITROSTAT) 0.4  MG SL tablet Place 1 tablet (0.4 mg total) under the tongue every 5 (five) minutes as needed. 02/17/11  Yes Minus Breeding, MD  omeprazole (PRILOSEC) 40 MG capsule Take 40 mg by mouth daily.     Yes Historical Provider, MD  pravastatin (PRAVACHOL) 20 MG tablet Take 20 mg by mouth daily.   Yes Historical Provider, MD  spironolactone (ALDACTONE) 25 MG tablet Take 25 mg by mouth daily.     Yes Historical Provider, MD  tadalafil (CIALIS) 20 MG tablet Take 20 mg by mouth daily as needed for erectile dysfunction.   Yes Historical Provider, MD  tamsulosin (FLOMAX) 0.4 MG CAPS capsule Take 0.4 mg by mouth.   Yes Historical Provider, MD    Allergies as of 03/04/2015  . (No Known Allergies)    Family History  Problem Relation Age of Onset  . Heart disease Father     Social History   Social History  . Marital Status: Married    Spouse Name: N/A  . Number of Children: 1  . Years of Education: N/A   Occupational History  .     Social History Main Topics  . Smoking status: Former Smoker    Quit date: 09/14/1981  . Smokeless tobacco: Not on file     Comment: 1 ppd x 15 years, quit 30 years  ago   . Alcohol Use: Yes     Comment: occ  . Drug Use: No  . Sexual Activity: Not on file   Other Topics Concern  . Not on file   Social History Narrative   Married.     Review of Systems: See HPI, otherwise negative ROS  Physical Exam: BP 139/77 mmHg  Pulse 81  Temp(Src) 97.5 F (36.4 C) (Tympanic)  Resp 16  Ht 6\' 2"  (1.88 m)  Wt 96.616 kg (213 lb)  BMI 27.34 kg/m2  SpO2 94% General:   Alert,  pleasant and cooperative in NAD Head:  Normocephalic and atraumatic. Neck:  Supple; no masses or thyromegaly. Lungs:  Clear throughout to auscultation.    Heart:  Regular rate and rhythm. Abdomen:  Soft, nontender and nondistended. Normal bowel sounds, without guarding, and without rebound.   Neurologic:  Alert and  oriented x4;  grossly normal neurologically.  Impression/Plan: Mark Burgess is here for an colonoscopy to be performed for Personal history of colon polyps  Risks, benefits, limitations, and alternatives regarding  colonoscopy have been reviewed with the patient.  Questions have been answered.  All parties agreeable.   Gaylyn Cheers, MD  03/27/2015, 9:17 AM

## 2015-03-27 NOTE — Op Note (Signed)
[facility] Patient Name: Mark Burgess Procedure Date: 03/27/2015 9:14 AM MRN: [id number] Account #: [id number] Date of Birth: [birth year] Admit Type: Outpatient Age: 74 Room: [facility] ENDO ROOM 3 Gender: Male Note Status: Finalized Procedure:         Colonoscopy Indications:       High risk colon cancer surveillance: Personal history of                     colonic polyps Providers:         Manya Silvas, MD Referring MD:      Ocie Cornfield. Ouida Sills, MD (Referring MD) Medicines:         Propofol per Anesthesia Complications:     No immediate complications. Procedure:         Pre-Anesthesia Assessment:                    - After reviewing the risks and benefits, the patient was                     deemed in satisfactory condition to undergo the procedure.                    After obtaining informed consent, the colonoscope was                     passed under direct vision. Throughout the procedure, the                     patient's blood pressure, pulse, and oxygen saturations                     were monitored continuously. The Colonoscope was                     introduced through the anus and advanced to the the cecum,                     identified by appendiceal orifice and ileocecal valve. The                     colonoscopy was performed without difficulty. The patient                     tolerated the procedure well. The quality of the bowel                     preparation was good. Findings:      A small polyp was found in the rectum. The polyp was sessile. The polyp       was removed with a hot snare. Resection and retrieval were complete.      A small polyp was found in the transverse colon. The polyp was sessile.       The polyp was removed with a cold snare. Resection and retrieval were       complete.      Five sessile polyps were found in the sigmoid colon. The polyps were       diminutive in size. These polyps were removed with a  jumbo cold forceps.       Resection and retrieval were complete.      Internal hemorrhoids were found during endoscopy. The hemorrhoids were       small and Grade I (internal hemorrhoids that do not prolapse).  Many small-mouthed diverticula were found in the sigmoid colon.      The exam was otherwise without abnormality. Impression:        - One small polyp in the rectum. Resected and retrieved.                    - One small polyp in the transverse colon. Resected and                     retrieved.                    - Five diminutive polyps in the sigmoid colon. Resected                     and retrieved.                    - Internal hemorrhoids.                    - Diverticulosis in the sigmoid colon.                    - The examination was otherwise normal. Recommendation:    - Await pathology results. Manya Silvas, MD 03/27/2015 9:46:33 AM This report has been signed electronically. Number of Addenda: 0 Note Initiated On: 03/27/2015 9:14 AM Scope Withdrawal Time: 0 hours 16 minutes 29 seconds  Total Procedure Duration: 0 hours 20 minutes 32 seconds       [facility]

## 2015-03-28 ENCOUNTER — Encounter: Payer: Self-pay | Admitting: Unknown Physician Specialty

## 2015-03-28 LAB — SURGICAL PATHOLOGY

## 2015-04-01 NOTE — Anesthesia Postprocedure Evaluation (Signed)
Anesthesia Post Note  Patient: Mark Burgess  Procedure(s) Performed: Procedure(s) (LRB): COLONOSCOPY WITH PROPOFOL (N/A)  Patient location during evaluation: Endoscopy Anesthesia Type: General Level of consciousness: awake, awake and alert and oriented Pain management: pain level controlled Vital Signs Assessment: post-procedure vital signs reviewed and stable Respiratory status: spontaneous breathing Cardiovascular status: blood pressure returned to baseline Anesthetic complications: no    Last Vitals:  Filed Vitals:   03/27/15 1020 03/27/15 1030  BP: 103/51 105/55  Pulse: 72 71  Temp:    Resp: 20 19    Last Pain:  Filed Vitals:   03/28/15 0810  PainSc: 0-No pain                 Vasily Fedewa

## 2016-06-25 NOTE — Progress Notes (Signed)
Cardiology Office Note   Date:  06/28/2016   ID:  Mark Burgess, Mark Burgess Oct 04, 1940, MRN [id number]  PCP:  Kirk Ruths., MD  Cardiologist:   Minus Breeding, MD    Chief Complaint  Patient presents with  . Coronary Artery Disease  . Cardiomyopathy     History of Present Illness: Mark Burgess is a 76 y.o. male who presents for follow-up of aortic stenosis, mild left ventricular dysfunction and coronary artery disease as described below. I have not seen him since 2014. He's been doing relatively well although recently he had bronchitis. She was subsequently diagnosed with emphysema. He slowly recovering from bronchitis. He was able to do a lot of yard work yesterday picking up limbs and cleaning and downed tree.  With this level of activity he did not have any chest pressure, or arm discomfort. He didn't have any significant shortness of breath. He's had no PND or orthopnea. He's had no weight gain or edema.  Past Medical History:  Diagnosis Date  . Aortic stenosis    apparent bicuspid aortic valve  . CAD (coronary artery disease)    subtotal occlusion of mid left circumflex, treated successfully w percutaneous coronary intervention using a 3.0- x 16-mm drug eluting stent. Percent stenois was taken from 95-0 w TIMI 2 flow taken to TIMI 3 flow post percutaneous coronary intervention  . Diabetes mellitus    recent diagnosis  . 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 40%  . Emphysema lung ([place])   . HTN (hypertension)    x8 years    Past Surgical History:  Procedure Laterality Date  . APPENDECTOMY    . COLONOSCOPY WITH PROPOFOL N/A 03/27/2015   Procedure: COLONOSCOPY WITH PROPOFOL;  Surgeon: Manya Silvas, MD;  Location: [facility] ENDOSCOPY;  Service: Endoscopy;  Laterality: N/A;  . [place], 2006  . TONSILLECTOMY       Current Outpatient Prescriptions  Medication Sig Dispense Refill  . ADVAIR HFA 115-21 MCG/ACT inhaler Inhale  2 puffs into the lungs every 12 (twelve) hours.    Marland Kitchen allopurinol (ZYLOPRIM) 300 MG tablet Take 300 mg by mouth daily.      Marland Kitchen amLODipine (NORVASC) 5 MG tablet Take 5 mg by mouth daily.      Marland Kitchen aspirin EC 81 MG tablet Take 81 mg by mouth daily.    Marland Kitchen atorvastatin (LIPITOR) 80 MG tablet Take 80 mg by mouth daily.    . carvedilol (COREG) 12.5 MG tablet Take 1 tablet (12.5 mg total) by mouth 2 (two) times daily. 60 tablet 5  . finasteride (PROSCAR) 5 MG tablet Take 5 mg by mouth daily.    Marland Kitchen latanoprost (XALATAN) 0.005 % ophthalmic solution Place 1 drop into both eyes daily.    Marland Kitchen lisinopril-hydrochlorothiazide (PRINZIDE,ZESTORETIC) 20-12.5 MG per tablet Take 1 tablet by mouth daily.      . metFORMIN (GLUCOPHAGE) 500 MG tablet Take 500 mg by mouth daily with breakfast.     . nitroGLYCERIN (NITROSTAT) 0.4 MG SL tablet Place 1 tablet (0.4 mg total) under the tongue every 5 (five) minutes as needed. 25 tablet 6  . omeprazole (PRILOSEC) 40 MG capsule Take 40 mg by mouth daily.      Marland Kitchen spironolactone (ALDACTONE) 25 MG tablet Take 25 mg by mouth daily.      . tadalafil (CIALIS) 20 MG tablet Take 20 mg by mouth daily as needed for erectile dysfunction.    . tamsulosin (FLOMAX)  0.4 MG CAPS capsule Take 0.4 mg by mouth.     No current facility-administered medications for this visit.     Allergies:   Patient has no known allergies.    Social History:  The patient  reports that he quit smoking about 34 years ago. He has never used smokeless tobacco. He reports that he drinks alcohol. He reports that he does not use drugs.   Family History:  The patient's family history includes Healthy in his mother; Heart disease in his father.    ROS:  Please see the history of present illness.   Otherwise, review of systems are positive for none.   All other systems are reviewed and negative.    PHYSICAL EXAM: VS:  BP 130/73 (BP Location: Left Arm)   Pulse 76   Ht 6\' 2"  (1.88 m)   Wt 218 lb (98.9 kg)   BMI 27.99  kg/m  , BMI Body mass index is 27.99 kg/m. GENERAL:  Well appearing HEENT:  Pupils equal round and reactive, fundi not visualized, oral mucosa unremarkable NECK:  No jugular venous distention, waveform within normal limits, carotid upstroke brisk and symmetric, no bruits, no thyromegaly LYMPHATICS:  No cervical, inguinal adenopathy LUNGS:  Clear to auscultation bilaterally BACK:  No CVA tenderness CHEST:  Unremarkable HEART:  PMI not displaced or sustained,S1 and S2 within normal limits, no S3, no S4, no clicks, no rubs, no murmurs ABD:  Flat, positive bowel sounds normal in frequency in pitch, no bruits, no rebound, no guarding, no midline pulsatile mass, no hepatomegaly, no splenomegaly EXT:  2 plus pulses throughout, no edema, no cyanosis no clubbing SKIN:  No rashes no nodules NEURO:  Cranial nerves II through XII grossly intact, motor grossly intact throughout PSYCH:  Cognitively intact, oriented to person place and time    EKG:  EKG is ordered today. The ekg ordered today demonstrates sinus rhythm, rate 74, left axis deviation, borderline interventricular conduction delay, no acute ST-T wave changes.   Recent Labs: No results found for requested labs within last 8760 hours.    Lipid Panel   Wt Readings from Last 3 Encounters:  06/26/16 218 lb (98.9 kg)  03/27/15 213 lb (96.6 kg)  02/09/13 223 lb (101.2 kg)      Other studies Reviewed: Additional studies/ records that were reviewed today include: None. Review of the above records demonstrates:  Please see elsewhere in the note.     ASSESSMENT AND PLAN:  CAD:  The patient has no new sypmtoms.  No further cardiovascular testing is indicated.  We will continue with aggressive risk reduction and meds as listed.  AS:  This was very mild. This will be assessed as below.  CM:  He has had a reduced ejection fraction in the past. For now I would like to check an echocardiogram and may titrate his meds.  OVERWEIGHT:  The  patient understands the need to lose weight with diet and exercise. We have discussed specific strategies for this.  DM:  His A1C was 7.5.  This is improved but not at target.  I will defer to Kirk Ruths., MD   HTN:  The blood pressure is at target. No change in medications is indicated. We will continue with therapeutic lifestyle changes (TLC).   Current medicines are reviewed at length with the patient today.  The patient does not have concerns regarding medicines.  The following changes have been made:  no change  Labs/ tests ordered today include:   Orders  Placed This Encounter  Procedures  . EKG 12-Lead  . ECHOCARDIOGRAM COMPLETE     Disposition:   FU with one year.     Signed, Minus Breeding, MD  06/28/2016 8:25 PM    [facility]

## 2016-06-26 ENCOUNTER — Encounter: Payer: Self-pay | Admitting: Cardiology

## 2016-06-26 ENCOUNTER — Ambulatory Visit (INDEPENDENT_AMBULATORY_CARE_PROVIDER_SITE_OTHER): Payer: Medicare HMO | Admitting: Cardiology

## 2016-06-26 VITALS — BP 130/73 | HR 76 | Ht 74.0 in | Wt 218.0 lb

## 2016-06-26 DIAGNOSIS — I429 Cardiomyopathy, unspecified: Secondary | ICD-10-CM | POA: Diagnosis not present

## 2016-06-26 DIAGNOSIS — I350 Nonrheumatic aortic (valve) stenosis: Secondary | ICD-10-CM | POA: Diagnosis not present

## 2016-06-26 DIAGNOSIS — I2510 Atherosclerotic heart disease of native coronary artery without angina pectoris: Secondary | ICD-10-CM

## 2016-06-26 MED ORDER — NITROGLYCERIN 0.4 MG SL SUBL: 0.4000 mg | SUBLINGUAL_TABLET | SUBLINGUAL | 6 refills | Status: DC | PRN

## 2016-06-26 NOTE — Patient Instructions (Signed)

## 2016-06-28 ENCOUNTER — Encounter: Payer: Self-pay | Admitting: Cardiology

## 2016-07-15 ENCOUNTER — Ambulatory Visit (HOSPITAL_COMMUNITY): Payer: Medicare HMO | Attending: Cardiology

## 2016-07-15 ENCOUNTER — Other Ambulatory Visit: Payer: Self-pay

## 2016-07-15 DIAGNOSIS — I119 Hypertensive heart disease without heart failure: Secondary | ICD-10-CM | POA: Diagnosis not present

## 2016-07-15 DIAGNOSIS — I361 Nonrheumatic tricuspid (valve) insufficiency: Secondary | ICD-10-CM | POA: Insufficient documentation

## 2016-07-15 DIAGNOSIS — I2510 Atherosclerotic heart disease of native coronary artery without angina pectoris: Secondary | ICD-10-CM | POA: Insufficient documentation

## 2016-07-15 DIAGNOSIS — E119 Type 2 diabetes mellitus without complications: Secondary | ICD-10-CM | POA: Diagnosis not present

## 2016-07-15 DIAGNOSIS — I255 Ischemic cardiomyopathy: Secondary | ICD-10-CM | POA: Diagnosis not present

## 2016-07-15 DIAGNOSIS — I340 Nonrheumatic mitral (valve) insufficiency: Secondary | ICD-10-CM | POA: Insufficient documentation

## 2016-07-15 DIAGNOSIS — I358 Other nonrheumatic aortic valve disorders: Secondary | ICD-10-CM | POA: Insufficient documentation

## 2016-07-15 DIAGNOSIS — I429 Cardiomyopathy, unspecified: Secondary | ICD-10-CM | POA: Diagnosis not present

## 2016-07-15 DIAGNOSIS — I77810 Thoracic aortic ectasia: Secondary | ICD-10-CM | POA: Diagnosis not present

## 2016-07-15 MED ORDER — PERFLUTREN LIPID MICROSPHERE
1.0000 mL | INTRAVENOUS | Status: AC | PRN
  Administered 2016-07-15: 2 mL via INTRAVENOUS

## 2017-02-16 ENCOUNTER — Other Ambulatory Visit: Payer: Self-pay | Admitting: Urology

## 2017-02-22 ENCOUNTER — Encounter (HOSPITAL_COMMUNITY): Payer: Self-pay | Admitting: *Deleted

## 2017-02-25 ENCOUNTER — Ambulatory Visit (HOSPITAL_COMMUNITY): Payer: Medicare HMO

## 2017-02-25 ENCOUNTER — Encounter (HOSPITAL_COMMUNITY): Payer: Self-pay | Admitting: General Practice

## 2017-02-25 ENCOUNTER — Ambulatory Visit (HOSPITAL_COMMUNITY)
Admission: RE | Admit: 2017-02-25 | Discharge: 2017-02-25 | Disposition: A | Discharge status: Home / Self Care | Payer: Medicare HMO | Source: Ambulatory Visit | Attending: Urology | Admitting: Urology

## 2017-02-25 ENCOUNTER — Encounter (HOSPITAL_COMMUNITY)
Admission: RE | Disposition: A | Discharge status: Home / Self Care | Payer: Self-pay | Source: Ambulatory Visit | Attending: Urology

## 2017-02-25 DIAGNOSIS — Z79899 Other long term (current) drug therapy: Secondary | ICD-10-CM | POA: Insufficient documentation

## 2017-02-25 DIAGNOSIS — Z955 Presence of coronary angioplasty implant and graft: Secondary | ICD-10-CM | POA: Diagnosis not present

## 2017-02-25 DIAGNOSIS — I252 Old myocardial infarction: Secondary | ICD-10-CM | POA: Insufficient documentation

## 2017-02-25 DIAGNOSIS — Z7984 Long term (current) use of oral hypoglycemic drugs: Secondary | ICD-10-CM | POA: Diagnosis not present

## 2017-02-25 DIAGNOSIS — E119 Type 2 diabetes mellitus without complications: Secondary | ICD-10-CM | POA: Diagnosis not present

## 2017-02-25 DIAGNOSIS — Z01818 Encounter for other preprocedural examination: Secondary | ICD-10-CM | POA: Insufficient documentation

## 2017-02-25 DIAGNOSIS — K8020 Calculus of gallbladder without cholecystitis without obstruction: Secondary | ICD-10-CM | POA: Diagnosis not present

## 2017-02-25 DIAGNOSIS — N200 Calculus of kidney: Secondary | ICD-10-CM | POA: Insufficient documentation

## 2017-02-25 HISTORY — PX: EXTRACORPOREAL SHOCK WAVE LITHOTRIPSY: SHX1557

## 2017-02-25 HISTORY — DX: Personal history of urinary calculi: Z87.442

## 2017-02-25 LAB — GLUCOSE, CAPILLARY: Glucose-Capillary: 152 mg/dL — ABNORMAL HIGH (ref 65–99)

## 2017-02-25 SURGERY — LITHOTRIPSY, ESWL
Anesthesia: LOCAL | Laterality: Right

## 2017-02-25 MED ORDER — DIPHENHYDRAMINE HCL 25 MG PO CAPS
25.0000 mg | ORAL_CAPSULE | ORAL | Status: AC
  Administered 2017-02-25: 25 mg via ORAL
  Filled 2017-02-25: qty 1

## 2017-02-25 MED ORDER — SODIUM CHLORIDE 0.9 % IV SOLN
INTRAVENOUS | Status: DC
  Administered 2017-02-25: 08:00:00 via INTRAVENOUS

## 2017-02-25 MED ORDER — CIPROFLOXACIN HCL 500 MG PO TABS
500.0000 mg | ORAL_TABLET | ORAL | Status: AC
  Administered 2017-02-25: 500 mg via ORAL
  Filled 2017-02-25: qty 1

## 2017-02-25 MED ORDER — DIAZEPAM 5 MG PO TABS
10.0000 mg | ORAL_TABLET | ORAL | Status: AC
  Administered 2017-02-25: 10 mg via ORAL
  Filled 2017-02-25: qty 2

## 2017-02-25 NOTE — Discharge Instructions (Signed)
Lithotripsy, Care After °This sheet gives you information about how to care for yourself after your procedure. Your health care provider may also give you more specific instructions. If you have problems or questions, contact your health care provider. °What can I expect after the procedure? °After the procedure, it is common to have: °· Some blood in your urine. This should only last for a few days. °· Soreness in your back, sides, or upper abdomen for a few days. °· Blotches or bruises on your back where the pressure wave entered the skin. °· Pain, discomfort, or nausea when pieces (fragments) of the kidney stone move through the tube that carries urine from the kidney to the bladder (ureter). Stone fragments may pass soon after the procedure, but they may continue to pass for up to 4-8 weeks. °? If you have severe pain or nausea, contact your health care provider. This may be caused by a large stone that was not broken up, and this may mean that you need more treatment. °· Some pain or discomfort during urination. °· Some pain or discomfort in the lower abdomen or (in men) at the base of the penis. ° °Follow these instructions at home: °Medicines °· Take over-the-counter and prescription medicines only as told by your health care provider. °· If you were prescribed an antibiotic medicine, take it as told by your health care provider. Do not stop taking the antibiotic even if you start to feel better. °· Do not drive for 24 hours if you were given a medicine to help you relax (sedative). °· Do not drive or use heavy machinery while taking prescription pain medicine. °Eating and drinking °· Drink enough water and fluids to keep your urine clear or pale yellow. This helps any remaining pieces of the stone to pass. It can also help prevent new stones from forming. °· Eat plenty of fresh fruits and vegetables. °· Follow instructions from your health care provider about eating and drinking restrictions. You may be  instructed: °? To reduce how much salt (sodium) you eat or drink. Check ingredients and nutrition facts on packaged foods and beverages. °? To reduce how much meat you eat. °· Eat the recommended amount of calcium for your age and gender. Ask your health care provider how much calcium you should have. °General instructions °· Get plenty of rest. °· Most people can resume normal activities 1-2 days after the procedure. Ask your health care provider what activities are safe for you. °· If directed, strain all urine through the strainer that was provided by your health care provider. °? Keep all fragments for your health care provider to see. Any stones that are found may be sent to a medical lab for examination. The stone may be as small as a grain of salt. °· Keep all follow-up visits as told by your health care provider. This is important. °Contact a health care provider if: °· You have pain that is severe or does not get better with medicine. °· You have nausea that is severe or does not go away. °· You have blood in your urine longer than your health care provider told you to expect. °· You have more blood in your urine. °· You have pain during urination that does not go away. °· You urinate more frequently than usual and this does not go away. °· You develop a rash or any other possible signs of an allergic reaction. °Get help right away if: °· You have severe pain in   your back, sides, or upper abdomen. °· You have severe pain while urinating. °· Your urine is very dark red. °· You have blood in your stool (feces). °· You cannot pass any urine at all. °· You feel a strong urge to urinate after emptying your bladder. °· You have a fever or chills. °· You develop shortness of breath, difficulty breathing, or chest pain. °· You have severe nausea that leads to persistent vomiting. °· You faint. °Summary °· After this procedure, it is common to have some pain, discomfort, or nausea when pieces (fragments) of the  kidney stone move through the tube that carries urine from the kidney to the bladder (ureter). If this pain or nausea is severe, however, you should contact your health care provider. °· Most people can resume normal activities 1-2 days after the procedure. Ask your health care provider what activities are safe for you. °· Drink enough water and fluids to keep your urine clear or pale yellow. This helps any remaining pieces of the stone to pass, and it can help prevent new stones from forming. °· If directed, strain your urine and keep all fragments for your health care provider to see. Fragments or stones may be as small as a grain of salt. °· Get help right away if you have severe pain in your back, sides, or upper abdomen or have severe pain while urinating. °This information is not intended to replace advice given to you by your health care provider. Make sure you discuss any questions you have with your health care provider. °Document Released: 05/17/2007 Document Revised: 03/18/2016 Document Reviewed: 03/18/2016 °Elsevier Interactive Patient Education © 2017 Elsevier Inc. ° °

## 2017-02-26 ENCOUNTER — Encounter (HOSPITAL_COMMUNITY): Payer: Self-pay | Admitting: Urology

## 2017-03-10 ENCOUNTER — Other Ambulatory Visit: Payer: Self-pay | Admitting: Urology

## 2017-03-10 ENCOUNTER — Encounter (HOSPITAL_COMMUNITY): Payer: Self-pay | Admitting: *Deleted

## 2017-03-10 NOTE — Progress Notes (Signed)
LM on VM for Selita to have Dr. Diona Fanti place orders in [facility]!

## 2017-03-10 NOTE — Progress Notes (Signed)
Spoke with Dr. Marcie Bal, Anesthesia about patient's medical history, Last office visit from Dr. Percival Spanish on 06/28/2016 and ECHO on 07/15/2016. Per Dr. Marcie Bal, patient is OK for surgery.

## 2017-03-11 ENCOUNTER — Ambulatory Visit (HOSPITAL_COMMUNITY): Payer: Medicare HMO

## 2017-03-11 ENCOUNTER — Encounter (HOSPITAL_COMMUNITY): Payer: Self-pay | Admitting: Certified Registered"

## 2017-03-11 ENCOUNTER — Ambulatory Visit (HOSPITAL_COMMUNITY): Payer: Medicare HMO | Admitting: Certified Registered"

## 2017-03-11 ENCOUNTER — Encounter (HOSPITAL_COMMUNITY)
Admission: RE | Disposition: A | Discharge status: Home / Self Care | Payer: Self-pay | Source: Ambulatory Visit | Attending: Urology

## 2017-03-11 ENCOUNTER — Ambulatory Visit (HOSPITAL_COMMUNITY)
Admission: RE | Admit: 2017-03-11 | Discharge: 2017-03-11 | Disposition: A | Discharge status: Home / Self Care | Payer: Medicare HMO | Source: Ambulatory Visit | Attending: Urology | Admitting: Urology

## 2017-03-11 DIAGNOSIS — N280 Ischemia and infarction of kidney: Secondary | ICD-10-CM | POA: Insufficient documentation

## 2017-03-11 DIAGNOSIS — N138 Other obstructive and reflux uropathy: Secondary | ICD-10-CM | POA: Insufficient documentation

## 2017-03-11 DIAGNOSIS — Z8249 Family history of ischemic heart disease and other diseases of the circulatory system: Secondary | ICD-10-CM | POA: Insufficient documentation

## 2017-03-11 DIAGNOSIS — I110 Hypertensive heart disease with heart failure: Secondary | ICD-10-CM | POA: Diagnosis not present

## 2017-03-11 DIAGNOSIS — Q231 Congenital insufficiency of aortic valve: Secondary | ICD-10-CM | POA: Diagnosis not present

## 2017-03-11 DIAGNOSIS — Z7984 Long term (current) use of oral hypoglycemic drugs: Secondary | ICD-10-CM | POA: Insufficient documentation

## 2017-03-11 DIAGNOSIS — Z87891 Personal history of nicotine dependence: Secondary | ICD-10-CM | POA: Insufficient documentation

## 2017-03-11 DIAGNOSIS — I2510 Atherosclerotic heart disease of native coronary artery without angina pectoris: Secondary | ICD-10-CM | POA: Insufficient documentation

## 2017-03-11 DIAGNOSIS — N132 Hydronephrosis with renal and ureteral calculous obstruction: Secondary | ICD-10-CM | POA: Diagnosis not present

## 2017-03-11 DIAGNOSIS — Z79899 Other long term (current) drug therapy: Secondary | ICD-10-CM | POA: Diagnosis not present

## 2017-03-11 DIAGNOSIS — N401 Enlarged prostate with lower urinary tract symptoms: Secondary | ICD-10-CM | POA: Insufficient documentation

## 2017-03-11 DIAGNOSIS — Z955 Presence of coronary angioplasty implant and graft: Secondary | ICD-10-CM | POA: Insufficient documentation

## 2017-03-11 DIAGNOSIS — Z87442 Personal history of urinary calculi: Secondary | ICD-10-CM | POA: Diagnosis not present

## 2017-03-11 DIAGNOSIS — I509 Heart failure, unspecified: Secondary | ICD-10-CM | POA: Diagnosis not present

## 2017-03-11 DIAGNOSIS — I081 Rheumatic disorders of both mitral and tricuspid valves: Secondary | ICD-10-CM | POA: Insufficient documentation

## 2017-03-11 DIAGNOSIS — E119 Type 2 diabetes mellitus without complications: Secondary | ICD-10-CM | POA: Diagnosis not present

## 2017-03-11 DIAGNOSIS — J439 Emphysema, unspecified: Secondary | ICD-10-CM | POA: Insufficient documentation

## 2017-03-11 HISTORY — PX: HOLMIUM LASER APPLICATION: SHX5852

## 2017-03-11 HISTORY — PX: CYSTOSCOPY WITH STENT PLACEMENT: SHX5790

## 2017-03-11 HISTORY — PX: CYSTOSCOPY/RETROGRADE/URETEROSCOPY/STONE EXTRACTION WITH BASKET: SHX5317

## 2017-03-11 LAB — CBC
HCT: 33.6 % — ABNORMAL LOW (ref 39.0–52.0)
HEMOGLOBIN: 11 g/dL — AB (ref 13.0–17.0)
MCH: 30.1 pg (ref 26.0–34.0)
MCHC: 32.7 g/dL (ref 30.0–36.0)
MCV: 92.1 fL (ref 78.0–100.0)
PLATELETS: 310 10*3/uL (ref 150–400)
RBC: 3.65 MIL/uL — AB (ref 4.22–5.81)
RDW: 13.2 % (ref 11.5–15.5)
WBC: 9.2 10*3/uL (ref 4.0–10.5)

## 2017-03-11 LAB — BASIC METABOLIC PANEL
ANION GAP: 9 (ref 5–15)
BUN: 39 mg/dL — ABNORMAL HIGH (ref 6–20)
CALCIUM: 10.7 mg/dL — AB (ref 8.9–10.3)
CO2: 24 mmol/L (ref 22–32)
Chloride: 107 mmol/L (ref 101–111)
Creatinine, Ser: 1.64 mg/dL — ABNORMAL HIGH (ref 0.61–1.24)
GFR calc non Af Amer: 40 mL/min — ABNORMAL LOW (ref >60)
GFR, EST AFRICAN AMERICAN: 46 mL/min — AB (ref >60)
Glucose, Bld: 189 mg/dL — ABNORMAL HIGH (ref 65–99)
Potassium: 4.4 mmol/L (ref 3.5–5.1)
SODIUM: 140 mmol/L (ref 135–145)

## 2017-03-11 LAB — GLUCOSE, CAPILLARY
GLUCOSE-CAPILLARY: 169 mg/dL — AB (ref 65–99)
Glucose-Capillary: 163 mg/dL — ABNORMAL HIGH (ref 65–99)

## 2017-03-11 LAB — HEMOGLOBIN A1C
HEMOGLOBIN A1C: 7.5 % — AB (ref 4.8–5.6)
Mean Plasma Glucose: 168.55 mg/dL

## 2017-03-11 SURGERY — CYSTOSCOPY, WITH CALCULUS REMOVAL USING BASKET
Anesthesia: General | Laterality: Right

## 2017-03-11 MED ORDER — MIDAZOLAM HCL 5 MG/5ML IJ SOLN
INTRAMUSCULAR | Status: DC | PRN
  Administered 2017-03-11: 2 mg via INTRAVENOUS

## 2017-03-11 MED ORDER — 0.9 % SODIUM CHLORIDE (POUR BTL) OPTIME
TOPICAL | Status: DC | PRN
  Administered 2017-03-11: 1000 mL

## 2017-03-11 MED ORDER — SODIUM CHLORIDE 0.9 % IR SOLN
Status: DC | PRN
  Administered 2017-03-11: 7000 mL

## 2017-03-11 MED ORDER — CEFAZOLIN SODIUM-DEXTROSE 2-4 GM/100ML-% IV SOLN
2.0000 g | INTRAVENOUS | Status: AC
  Administered 2017-03-11: 2 g via INTRAVENOUS
  Filled 2017-03-11: qty 100

## 2017-03-11 MED ORDER — LIDOCAINE 2% (20 MG/ML) 5 ML SYRINGE
INTRAMUSCULAR | Status: DC | PRN
  Administered 2017-03-11: 80 mg via INTRAVENOUS

## 2017-03-11 MED ORDER — KETOROLAC TROMETHAMINE 30 MG/ML IJ SOLN
15.0000 mg | Freq: Once | INTRAMUSCULAR | Status: DC | PRN
  Administered 2017-03-11: 15 mg via INTRAVENOUS
  Filled 2017-03-11: qty 1

## 2017-03-11 MED ORDER — FENTANYL CITRATE (PF) 100 MCG/2ML IJ SOLN
INTRAMUSCULAR | Status: AC
  Filled 2017-03-11: qty 2

## 2017-03-11 MED ORDER — PROPOFOL 10 MG/ML IV BOLUS
INTRAVENOUS | Status: AC
  Filled 2017-03-11: qty 20

## 2017-03-11 MED ORDER — ONDANSETRON HCL 4 MG/2ML IJ SOLN
INTRAMUSCULAR | Status: AC
  Filled 2017-03-11: qty 2

## 2017-03-11 MED ORDER — DEXAMETHASONE SODIUM PHOSPHATE 10 MG/ML IJ SOLN
INTRAMUSCULAR | Status: DC | PRN
  Administered 2017-03-11: 10 mg via INTRAVENOUS

## 2017-03-11 MED ORDER — FENTANYL CITRATE (PF) 100 MCG/2ML IJ SOLN
25.0000 ug | INTRAMUSCULAR | Status: DC | PRN
  Administered 2017-03-11: 25 ug via INTRAVENOUS
  Administered 2017-03-11: 50 ug via INTRAVENOUS
  Administered 2017-03-11: 25 ug via INTRAVENOUS

## 2017-03-11 MED ORDER — PHENYLEPHRINE 40 MCG/ML (10ML) SYRINGE FOR IV PUSH (FOR BLOOD PRESSURE SUPPORT)
PREFILLED_SYRINGE | INTRAVENOUS | Status: DC | PRN
  Administered 2017-03-11 (×4): 80 ug via INTRAVENOUS

## 2017-03-11 MED ORDER — PROMETHAZINE HCL 25 MG/ML IJ SOLN: 6.2500 mg | INTRAMUSCULAR | Status: DC | PRN

## 2017-03-11 MED ORDER — PROPOFOL 10 MG/ML IV BOLUS
INTRAVENOUS | Status: DC | PRN
  Administered 2017-03-11: 120 mg via INTRAVENOUS

## 2017-03-11 MED ORDER — EPHEDRINE 5 MG/ML INJ
INTRAVENOUS | Status: AC
  Filled 2017-03-11: qty 10

## 2017-03-11 MED ORDER — SULFAMETHOXAZOLE-TRIMETHOPRIM 800-160 MG PO TABS: 1.0000 | ORAL_TABLET | Freq: Two times a day (BID) | ORAL | 0 refills | Status: DC

## 2017-03-11 MED ORDER — MIDAZOLAM HCL 2 MG/2ML IJ SOLN
INTRAMUSCULAR | Status: AC
  Filled 2017-03-11: qty 2

## 2017-03-11 MED ORDER — ONDANSETRON HCL 4 MG/2ML IJ SOLN
INTRAMUSCULAR | Status: DC | PRN
  Administered 2017-03-11: 4 mg via INTRAVENOUS

## 2017-03-11 MED ORDER — EPHEDRINE SULFATE-NACL 50-0.9 MG/10ML-% IV SOSY
PREFILLED_SYRINGE | INTRAVENOUS | Status: DC | PRN
  Administered 2017-03-11 (×2): 10 mg via INTRAVENOUS

## 2017-03-11 MED ORDER — FENTANYL CITRATE (PF) 100 MCG/2ML IJ SOLN
INTRAMUSCULAR | Status: DC | PRN
  Administered 2017-03-11: 25 ug via INTRAVENOUS
  Administered 2017-03-11: 50 ug via INTRAVENOUS
  Administered 2017-03-11: 25 ug via INTRAVENOUS

## 2017-03-11 MED ORDER — TRAMADOL HCL 50 MG PO TABS: 50.0000 mg | ORAL_TABLET | Freq: Four times a day (QID) | ORAL | 0 refills | Status: DC | PRN

## 2017-03-11 MED ORDER — PHENYLEPHRINE 40 MCG/ML (10ML) SYRINGE FOR IV PUSH (FOR BLOOD PRESSURE SUPPORT)
PREFILLED_SYRINGE | INTRAVENOUS | Status: AC
  Filled 2017-03-11: qty 10

## 2017-03-11 MED ORDER — DEXAMETHASONE SODIUM PHOSPHATE 10 MG/ML IJ SOLN
INTRAMUSCULAR | Status: AC
  Filled 2017-03-11: qty 1

## 2017-03-11 MED ORDER — LACTATED RINGERS IV SOLN
INTRAVENOUS | Status: DC
  Administered 2017-03-11 (×3): via INTRAVENOUS

## 2017-03-11 MED ORDER — LIDOCAINE 2% (20 MG/ML) 5 ML SYRINGE
INTRAMUSCULAR | Status: AC
  Filled 2017-03-11: qty 5

## 2017-03-11 SURGICAL SUPPLY — 29 items
BAG URINE DRAINAGE (UROLOGICAL SUPPLIES) ×3 IMPLANT
BAG URO CATCHER STRL LF (MISCELLANEOUS) ×3 IMPLANT
BASKET LASER NITINOL 1.9FR (BASKET) IMPLANT
BASKET ZERO TIP NITINOL 2.4FR (BASKET) IMPLANT
BSKT STON RTRVL ZERO TP 2.4FR (BASKET)
CATH FOLEY 2WAY SLVR  5CC 18FR (CATHETERS) ×2
CATH FOLEY 2WAY SLVR 5CC 18FR (CATHETERS) ×1 IMPLANT
CATH INTERMIT  6FR 70CM (CATHETERS) ×3 IMPLANT
CLOTH BEACON ORANGE TIMEOUT ST (SAFETY) ×3 IMPLANT
COVER FOOTSWITCH UNIV (MISCELLANEOUS) ×3 IMPLANT
COVER SURGICAL LIGHT HANDLE (MISCELLANEOUS) IMPLANT
EXTRACTOR STONE NITINOL NGAGE (UROLOGICAL SUPPLIES) ×3 IMPLANT
FIBER LASER FLEXIVA 365 (UROLOGICAL SUPPLIES) IMPLANT
FIBER LASER TRAC TIP (UROLOGICAL SUPPLIES) ×3 IMPLANT
GLOVE BIOGEL M 8.0 STRL (GLOVE) ×3 IMPLANT
GOWN STRL REUS W/ TWL XL LVL3 (GOWN DISPOSABLE) ×1 IMPLANT
GOWN STRL REUS W/TWL LRG LVL3 (GOWN DISPOSABLE) ×6 IMPLANT
GOWN STRL REUS W/TWL XL LVL3 (GOWN DISPOSABLE) ×3
GUIDEWIRE ANG ZIPWIRE 038X150 (WIRE) IMPLANT
GUIDEWIRE STR DUAL SENSOR (WIRE) ×3 IMPLANT
IV NS 1000ML (IV SOLUTION)
IV NS 1000ML BAXH (IV SOLUTION) IMPLANT
MANIFOLD NEPTUNE II (INSTRUMENTS) ×3 IMPLANT
PACK CYSTO (CUSTOM PROCEDURE TRAY) ×3 IMPLANT
SHEATH ACCESS URETERAL 54CM (SHEATH) ×3 IMPLANT
SHEATH URETERAL 12FRX35CM (MISCELLANEOUS) ×3 IMPLANT
STENT URET 6FRX24 CONTOUR (STENTS) ×3 IMPLANT
TUBING CONNECTING 10 (TUBING) ×2 IMPLANT
TUBING CONNECTING 10' (TUBING) ×1

## 2017-03-11 NOTE — Discharge Instructions (Signed)
1. You may see some blood in the urine and may have some burning with urination for 48-72 hours. You also may notice that you have to urinate more frequently or urgently after your procedure which is normal.  2. You should call should you develop an inability urinate, fever > 101, persistent nausea and vomiting that prevents you from eating or drinking to stay hydrated.  3. If you have a catheter, you will be taught how to take care of the catheter by the nursing staff prior to discharge from the hospital.  You may periodically feel a strong urge to void with the catheter in place.  This is a bladder spasm and most often can occur when having a bowel movement or moving around. It is typically self-limited and usually will stop after a few minutes.  You may use some Vaseline or Neosporin around the tip of the catheter to reduce friction at the tip of the penis. You may also see some blood in the urine.  A very small amount of blood can make the urine look quite red.  As long as the catheter is draining well, there usually is not a problem.  However, if the catheter is not draining well and is bloody, you should call the office [phone]) to notify us.  It is okay to remove the catheter as instructed on Friday morning.    General Anesthesia, Adult, Care After These instructions provide you with information about caring for yourself after your procedure. Your health care provider may also give you more specific instructions. Your treatment has been planned according to current medical practices, but problems sometimes occur. Call your health care provider if you have any problems or questions after your procedure. What can I expect after the procedure? After the procedure, it is common to have:  Vomiting.  A sore throat.  Mental slowness.  It is common to feel:  Nauseous.  Cold or shivery.  Sleepy.  Tired.  Sore or achy, even in parts of your body where you did not have  surgery.  Follow these instructions at home: For at least 24 hours after the procedure:  Do not: ? Participate in activities where you could fall or become injured. ? Drive. ? Use heavy machinery. ? Drink alcohol. ? Take sleeping pills or medicines that cause drowsiness. ? Make important decisions or sign legal documents. ? Take care of children on your own.  Rest. Eating and drinking  If you vomit, drink water, juice, or soup when you can drink without vomiting.  Drink enough fluid to keep your urine clear or pale yellow.  Make sure you have little or no nausea before eating solid foods.  Follow the diet recommended by your health care provider. General instructions  Have a responsible adult stay with you until you are awake and alert.  Return to your normal activities as told by your health care provider. Ask your health care provider what activities are safe for you.  Take over-the-counter and prescription medicines only as told by your health care provider.  If you smoke, do not smoke without supervision.  Keep all follow-up visits as told by your health care provider. This is important. Contact a health care provider if:  You continue to have nausea or vomiting at home, and medicines are not helpful.  You cannot drink fluids or start eating again.  You cannot urinate after 8-12 hours.  You develop a skin rash.  You have fever.  You have increasing redness  at the site of your procedure. Get help right away if:  You have difficulty breathing.  You have chest pain.  You have unexpected bleeding.  You feel that you are having a life-threatening or urgent problem. This information is not intended to replace advice given to you by your health care provider. Make sure you discuss any questions you have with your health care provider. Document Released: 08/03/2000 Document Revised: 09/30/2015 Document Reviewed: 04/11/2015 Elsevier Interactive Patient Education   [employer].

## 2017-03-11 NOTE — Anesthesia Preprocedure Evaluation (Signed)
Anesthesia Evaluation    Airway Mallampati: II  TM Distance: >3 FB Neck ROM: Full    Dental no notable dental hx.    Pulmonary former smoker,    Pulmonary exam normal breath sounds clear to auscultation       Cardiovascular hypertension, + CAD, + Cardiac Stents and +CHF  Normal cardiovascular exam Rhythm:Regular Rate:Normal  Left ventricle: The cavity size was normal. There was mild   concentric hypertrophy. Systolic function was mildly to   moderately reduced. The estimated ejection fraction was in the   range of 40% to 45%. There is hypokinesis in the basal   anteroseptal, inferoseptal and inferior walls. Doppler parameters   are consistent with abnormal left ventricular relaxation (grade 1   diastolic dysfunction). - Aortic valve: Trileaflet; mildly thickened, mildly calcified   leaflets. - Ascending aorta: The ascending aorta was mildly dilated measuring   40 mm. - Mitral valve: There was mild regurgitation. - Left atrium: The atrium was severely dilated. - Right ventricle: Systolic function was normal. - Tricuspid valve: There was mild regurgitation. - Pulmonary arteries: Systolic pressure was within the normal   range. - Inferior vena cava: The vessel was normal in size. - Pericardium, extracardiac: There was no pericardial effusion   Neuro/Psych    GI/Hepatic   Endo/Other  diabetes  Renal/GU      Musculoskeletal   Abdominal   Peds  Hematology   Anesthesia Other Findings   Reproductive/Obstetrics                             Anesthesia Physical Anesthesia Plan  ASA: III  Anesthesia Plan: General   Post-op Pain Management:    Induction: Intravenous  PONV Risk Score and Plan: 2 and Ondansetron and Dexamethasone  Airway Management Planned: LMA  Additional Equipment:   Intra-op Plan:   Post-operative Plan: Extubation in OR  Informed Consent: I have reviewed the  patients History and Physical, chart, labs and discussed the procedure including the risks, benefits and alternatives for the proposed anesthesia with the patient or authorized representative who has indicated his/her understanding and acceptance.   Dental advisory given  Plan Discussed with: CRNA and Surgeon  Anesthesia Plan Comments:         Anesthesia Quick Evaluation

## 2017-03-11 NOTE — H&P (Signed)
H&P  Chief Complaint: Right kidney stone  History of Present Illness: 76 year old male with persistent large (11x19 mm) right UPJ stone following ESWL. He presents now for ureteroscopic mgmt with holmium laser.  Past Medical History:  Diagnosis Date  . Aortic stenosis    apparent bicuspid aortic valve  . CAD (coronary artery disease)    subtotal occlusion of mid left circumflex, treated successfully w percutaneous coronary intervention using a 3.0- x 16-mm drug eluting stent. Percent stenois was taken from 95-0 w TIMI 2 flow taken to TIMI 3 flow post percutaneous coronary intervention  . Diabetes mellitus    recent diagnosis  . 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 40%  . Emphysema lung ([place])   . History of kidney stones   . HTN (hypertension)    x8 years    Past Surgical History:  Procedure Laterality Date  . APPENDECTOMY    . CARDIAC CATHETERIZATION    . COLONOSCOPY WITH PROPOFOL N/A 03/27/2015   Procedure: COLONOSCOPY WITH PROPOFOL;  Surgeon: Manya Silvas, MD;  Location: [facility] ENDOSCOPY;  Service: Endoscopy;  Laterality: N/A;  . EXTRACORPOREAL SHOCK WAVE LITHOTRIPSY Right 02/25/2017   Procedure: RIGHT EXTRACORPOREAL SHOCK WAVE LITHOTRIPSY (ESWL);  Surgeon: Cleon Gustin, MD;  Location: [facility] ORS;  Service: Urology;  Laterality: Right;  . [place], 2006  . TONSILLECTOMY      Home Medications:    Allergies: No Known Allergies  Family History  Problem Relation Age of Onset  . Heart disease Father        Died 86  . Healthy Mother     Social History:  reports that he quit smoking about 35 years ago. He has never used smokeless tobacco. He reports that he drinks alcohol. He reports that he does not use drugs.  ROS: A complete review of systems was performed.  All systems are negative except for pertinent findings as noted.  Physical Exam:  Vital signs in last 24 hours: Temp:  [98.7 F (37.1 C)] 98.7 F (37.1 C) (11/01  0737) Pulse Rate:  [88] 88 (11/01 0737) Resp:  [18] 18 (11/01 0737) BP: (137)/(65) 137/65 (11/01 0737) SpO2:  [94 %] 94 % (11/01 0737) Weight:  [92.1 kg (203 lb 2 oz)] 92.1 kg (203 lb 2 oz) (11/01 0737) Constitutional:  Alert and oriented, No acute distress Cardiovascular: Regular rate and rhythm, No JVD Respiratory: Normal respiratory effort, Lungs clear bilaterally GI: Abdomen is soft, nontender, nondistended, no abdominal masses Lymphatic: No lymphadenopathy Neurologic: Grossly intact, no focal deficits Psychiatric: Normal mood and affect  Laboratory Data:   Recent Labs  03/11/17 0748  WBC 9.2  HGB 11.0*  HCT 33.6*  PLT 310    No results for input(s): NA, K, CL, GLUCOSE, BUN, CALCIUM, CREATININE in the last 72 hours.  Invalid input(s): CO3   Results for orders placed or performed during the hospital encounter of 03/11/17 (from the past 24 hour(s))  Glucose, capillary     Status: Abnormal   Collection Time: 03/11/17  7:32 AM  Result Value Ref Range   Glucose-Capillary 169 (H) 65 - 99 mg/dL   Comment 1 Notify RN    Comment 2 Document in Chart   CBC     Status: Abnormal   Collection Time: 03/11/17  7:48 AM  Result Value Ref Range   WBC 9.2 4.0 - 10.5 K/uL   RBC 3.65 (L) 4.22 - 5.81 MIL/uL   Hemoglobin 11.0 (L)  13.0 - 17.0 g/dL   HCT 33.6 (L) 39.0 - 52.0 %   MCV 92.1 78.0 - 100.0 fL   MCH 30.1 26.0 - 34.0 pg   MCHC 32.7 30.0 - 36.0 g/dL   RDW 13.2 11.5 - 15.5 %   Platelets 310 150 - 400 K/uL   No results found for this or any previous visit (from the past 240 hour(s)).  Renal Function: No results for input(s): CREATININE in the last 168 hours. CrCl cannot be calculated (Patient's most recent lab result is older than the maximum 21 days allowed.).  Radiologic Imaging: No results found.  Impression/Assessment:  11 by 19 mm rt UPJ stone  Plan:  Cysto, rt retrograde ureteropyelogram, right ureteroscopy, HLL, extraction of stone, J2 stent placement

## 2017-03-11 NOTE — Op Note (Signed)
Preoperative diagnosis: Right proximal and right distal ureteral calculi  Postoperative diagnosis: Same  Principal procedure: Cystoscopy, right retrograde ureteropyelogram, fluoroscopic interpretation, holmium laser lithotripsy of right distal ureteral stone, extraction of right distal ureteral stone fragments, the holmium laser lithotripsy of large right proximal/renal stone, placement of 6 French by 24 cm contour double-J stent without tether  Surgeon: Delani Kohli  Anesthesia: General with LMA  Complications: None, but difficulty visualizing right renal pelvis and calyces due to the blood-tinged urine  Specimen: Right renal urine for culture, stone fragments  Drains: 24 cm by 6 French contour double-J stent, 18 French Foley catheter to bedside bag  Estimated blood loss: Less than 25 cc  Indications: 76 year old male with recent lithotripsy of a proximal right ureteral stone without significant fragmentation.  He presents at this time for definitive ureteroscopy and laser lithotripsy/extraction of proximal and distal ureteral calculi.  The procedure as well as risks and complications, including the need for possible second procedure have been discussed with him.  He understands these and desires to proceed.  Findings: BPH with moderate obstruction, median lobe.  Bladder urothelium was all normal.  No significant trabeculations.  Ureteral pyelogram on the right revealed a filling defect in the right distal ureter, approximately 6 mm in size as well as a large right proximal ureteral filling defect, both consistent with the known ureteral calculi.  There was moderate hydroureteronephrosis.  With ureteroscopy, there was some necrosis of the lower pole calyx on the right, perhaps due to prior with presence of his large ureteral stone.  Description of: The patient was properly identified and marked in the holding area.  He received preoperative IV antibiotics.  He was taken to the operating room  where general anesthesia was administered with the LMA.  He was placed in the dorsal lithotomy position.  Genitalia and perineum were prepped and draped.  Proper timeout was performed.  A 21 French panendoscope was advanced under direct vision through his urethra.  The above-mentioned findings were noted.  Thorough inspection of the bladder was carried out.  A right retrograde ureteropyelogram was carried out using a 6 [place] open-ended catheter and Omnipaque.  A filling defect seen in the distal ureter and a larger defect seen in the proximal ureter, both consistent with the stones.  0.038 inch sensor tip guidewire was then advanced through the open-ended catheter, and a curl was seen in the upper pole calyx with anoscopy.  The cystoscope and the open-ended catheter were removed.  The ureteral orifice was dilated first with the inner core and then the entire 12/14 ureteral access catheter.  I then left the guidewire in place.  The semirigid 6 French dual-lumen ureteroscope was advanced through the urethra and into the right ureter.  The distal ureteral stone was encountered.  It was too large to bring through the ureteral orifice with just the basket, so the 200 m fiber was used to apply laser energy to the stone and fragmented into 3-4 smaller fragments which were then easily grasped and brought into the bladder.  No further distal ureteral stone fragments were noted.  The ureteroscope could not be advanced up to the proximal ureteral stone.  Because of the risk used first a medium/37 cm access catheter to pass up to the stone.  I then passed the flexible ureteroscope through the access catheter, but the stone rinsed into the kidney.  At this point, I then passed the long axis catheter over the guidewire, gaining easy access to the right kidney.  The scope was then replaced through the access catheter.  Urine in the renal pelvis and calyces was quite bloody, most likely from long-term obstruction.  I did not  think it looked purulent.  It was sent for culture, however.  I encountered the stone, and fragmented the stone into multiple smaller fragments.  However, this was quite difficult locating the stone and fragments because of the murky/bloody urine.  Multiple smaller fragments were obtained from the laser lithotripsy.  They were difficult to visualize, so I felt that they could be left in place, and pass along the stent which was later placed.  I did not see any large fragments that needed to be fragmented into smaller ones, but again thorough inspection was difficult due to the bloody urine.  At this point, the scope was removed.  I replaced the guidewire through the access catheter, and backloaded the guidewire through the scope.  The scope was then used to pass the 24 cm by 6 [place] Contour double-J stent.  The tether had been removed.  Excellent proximal and distal curls were seen of the stent following removal of the guidewire.  I then retrieved the small stones from the distal fragmentation through the scope, and save these for specimen.  At this point, because of the length of the procedure and the median lobe as well as some blood, I placed an 18 French Foley catheter.  This was hooked to a bedside bag.  The patient was then awakened.  He was taken to the PACU in stable condition, having tolerated the procedure well.

## 2017-03-11 NOTE — Anesthesia Postprocedure Evaluation (Signed)
Anesthesia Post Note  Patient: Mark Burgess  Procedure(s) Performed: CYSTOSCOPY/RETROGRADE/URETEROSCOPY/STONE EXTRACTION WITH BASKET (Right ) CYSTOSCOPY WITH STENT PLACEMENT (Right ) HOLMIUM LASER APPLICATION (Right )     Patient location during evaluation: PACU Anesthesia Type: General Level of consciousness: awake and alert Pain management: pain level controlled Vital Signs Assessment: post-procedure vital signs reviewed and stable Respiratory status: spontaneous breathing, nonlabored ventilation, respiratory function stable and patient connected to nasal cannula oxygen Cardiovascular status: blood pressure returned to baseline and stable Postop Assessment: no apparent nausea or vomiting Anesthetic complications: no    Last Vitals:  Vitals:   03/11/17 0737 03/11/17 1115  BP: 137/65   Pulse: 88   Resp: 18   Temp: 37.1 C 36.6 C  SpO2: 94%     Last Pain:  Vitals:   03/11/17 1115  TempSrc:   PainSc: 6                  Mirinda Monte S

## 2017-03-11 NOTE — Interval H&P Note (Signed)
History and Physical Interval Note:  03/11/2017 9:24 AM  Mark Burgess  has presented today for surgery, with the diagnosis of RIGHT URETERAL STONE  The various methods of treatment have been discussed with the patient and family. After consideration of risks, benefits and other options for treatment, the patient has consented to  Procedure(s): CYSTOSCOPY/RETROGRADE/URETEROSCOPY/STONE EXTRACTION WITH BASKET (Right) CYSTOSCOPY WITH STENT PLACEMENT (Right) HOLMIUM LASER APPLICATION (Right) as a surgical intervention .  The patient's history has been reviewed, patient examined, no change in status, stable for surgery.  I have reviewed the patient's chart and labs.  Questions were answered to the patient's satisfaction.     Jorja Loa

## 2017-03-11 NOTE — Anesthesia Procedure Notes (Signed)
Procedure Name: LMA Insertion Date/Time: 03/11/2017 9:44 AM Performed by: Noralyn Pick D Pre-anesthesia Checklist: Patient identified, Emergency Drugs available, Suction available and Patient being monitored Patient Re-evaluated:Patient Re-evaluated prior to induction Oxygen Delivery Method: Circle system utilized Preoxygenation: Pre-oxygenation with 100% oxygen Induction Type: IV induction Ventilation: Mask ventilation without difficulty LMA: LMA inserted LMA Size: 5.0 Tube type: Oral Number of attempts: 1 Placement Confirmation: positive ETCO2 and breath sounds checked- equal and bilateral Tube secured with: Tape Dental Injury: Teeth and Oropharynx as per pre-operative assessment

## 2017-03-11 NOTE — Transfer of Care (Signed)
Immediate Anesthesia Transfer of Care Note  Patient: Mark Burgess  Procedure(s) Performed: CYSTOSCOPY/RETROGRADE/URETEROSCOPY/STONE EXTRACTION WITH BASKET (Right ) CYSTOSCOPY WITH STENT PLACEMENT (Right ) HOLMIUM LASER APPLICATION (Right )  Patient Location: PACU  Anesthesia Type:General  Level of Consciousness: awake, alert  and oriented  Airway & Oxygen Therapy: Patient Spontanous Breathing and Patient connected to face mask oxygen  Post-op Assessment: Report given to RN and Post -op Vital signs reviewed and stable  Post vital signs: Reviewed and stable  Last Vitals:  Vitals:   03/11/17 0737  BP: 137/65  Pulse: 88  Resp: 18  Temp: 37.1 C  SpO2: 94%    Last Pain:  Vitals:   03/11/17 0803  TempSrc:   PainSc: 0-No pain      Patients Stated Pain Goal: 4 (88/82/80 0349)  Complications: No apparent anesthesia complications

## 2017-03-12 ENCOUNTER — Encounter (HOSPITAL_COMMUNITY): Payer: Self-pay | Admitting: Urology

## 2017-03-12 LAB — URINE CULTURE: Culture: NO GROWTH

## 2017-03-16 LAB — ANAEROBIC CULTURE

## 2017-04-13 ENCOUNTER — Other Ambulatory Visit: Payer: Self-pay | Admitting: Urology

## 2017-04-15 ENCOUNTER — Other Ambulatory Visit: Payer: Self-pay

## 2017-04-15 ENCOUNTER — Encounter (HOSPITAL_COMMUNITY): Payer: Self-pay | Admitting: *Deleted

## 2017-04-19 ENCOUNTER — Ambulatory Visit (HOSPITAL_COMMUNITY)
Admission: RE | Admit: 2017-04-19 | Discharge: 2017-04-19 | Disposition: A | Discharge status: Home / Self Care | Payer: Medicare HMO | Source: Ambulatory Visit | Attending: Urology | Admitting: Urology

## 2017-04-19 ENCOUNTER — Ambulatory Visit (HOSPITAL_COMMUNITY): Payer: Medicare HMO

## 2017-04-19 ENCOUNTER — Encounter (HOSPITAL_COMMUNITY): Payer: Self-pay

## 2017-04-19 ENCOUNTER — Ambulatory Visit (HOSPITAL_COMMUNITY): Payer: Medicare HMO | Admitting: Certified Registered"

## 2017-04-19 ENCOUNTER — Encounter (HOSPITAL_COMMUNITY)
Admission: RE | Disposition: A | Discharge status: Home / Self Care | Payer: Self-pay | Source: Ambulatory Visit | Attending: Urology

## 2017-04-19 DIAGNOSIS — Z79899 Other long term (current) drug therapy: Secondary | ICD-10-CM | POA: Diagnosis not present

## 2017-04-19 DIAGNOSIS — N202 Calculus of kidney with calculus of ureter: Secondary | ICD-10-CM | POA: Diagnosis not present

## 2017-04-19 DIAGNOSIS — J439 Emphysema, unspecified: Secondary | ICD-10-CM | POA: Diagnosis not present

## 2017-04-19 DIAGNOSIS — I350 Nonrheumatic aortic (valve) stenosis: Secondary | ICD-10-CM | POA: Diagnosis not present

## 2017-04-19 DIAGNOSIS — E119 Type 2 diabetes mellitus without complications: Secondary | ICD-10-CM | POA: Diagnosis not present

## 2017-04-19 DIAGNOSIS — Z87891 Personal history of nicotine dependence: Secondary | ICD-10-CM | POA: Insufficient documentation

## 2017-04-19 DIAGNOSIS — I10 Essential (primary) hypertension: Secondary | ICD-10-CM | POA: Diagnosis not present

## 2017-04-19 DIAGNOSIS — T83122A Displacement of urinary stent, initial encounter: Secondary | ICD-10-CM | POA: Diagnosis not present

## 2017-04-19 DIAGNOSIS — Q231 Congenital insufficiency of aortic valve: Secondary | ICD-10-CM | POA: Diagnosis not present

## 2017-04-19 DIAGNOSIS — Z7984 Long term (current) use of oral hypoglycemic drugs: Secondary | ICD-10-CM | POA: Diagnosis not present

## 2017-04-19 DIAGNOSIS — I252 Old myocardial infarction: Secondary | ICD-10-CM | POA: Diagnosis not present

## 2017-04-19 DIAGNOSIS — I2510 Atherosclerotic heart disease of native coronary artery without angina pectoris: Secondary | ICD-10-CM | POA: Diagnosis not present

## 2017-04-19 DIAGNOSIS — K219 Gastro-esophageal reflux disease without esophagitis: Secondary | ICD-10-CM | POA: Insufficient documentation

## 2017-04-19 DIAGNOSIS — Y738 Miscellaneous gastroenterology and urology devices associated with adverse incidents, not elsewhere classified: Secondary | ICD-10-CM | POA: Diagnosis not present

## 2017-04-19 DIAGNOSIS — Z87442 Personal history of urinary calculi: Secondary | ICD-10-CM | POA: Insufficient documentation

## 2017-04-19 HISTORY — DX: Acute myocardial infarction, unspecified: I21.9

## 2017-04-19 HISTORY — PX: CYSTOSCOPY WITH URETEROSCOPY AND STENT PLACEMENT: SHX6377

## 2017-04-19 HISTORY — DX: Gastro-esophageal reflux disease without esophagitis: K21.9

## 2017-04-19 LAB — BASIC METABOLIC PANEL
ANION GAP: 8 (ref 5–15)
BUN: 20 mg/dL (ref 6–20)
CO2: 26 mmol/L (ref 22–32)
Calcium: 9.9 mg/dL (ref 8.9–10.3)
Chloride: 105 mmol/L (ref 101–111)
Creatinine, Ser: 0.91 mg/dL (ref 0.61–1.24)
GFR calc Af Amer: >60 mL/min (ref >60)
GLUCOSE: 189 mg/dL — AB (ref 65–99)
POTASSIUM: 3.7 mmol/L (ref 3.5–5.1)
Sodium: 139 mmol/L (ref 135–145)

## 2017-04-19 LAB — CBC
HEMATOCRIT: 35.8 % — AB (ref 39.0–52.0)
HEMOGLOBIN: 11.7 g/dL — AB (ref 13.0–17.0)
MCH: 30.1 pg (ref 26.0–34.0)
MCHC: 32.7 g/dL (ref 30.0–36.0)
MCV: 92 fL (ref 78.0–100.0)
Platelets: 190 10*3/uL (ref 150–400)
RBC: 3.89 MIL/uL — ABNORMAL LOW (ref 4.22–5.81)
RDW: 14.8 % (ref 11.5–15.5)
WBC: 7.8 10*3/uL (ref 4.0–10.5)

## 2017-04-19 LAB — GLUCOSE, CAPILLARY
Glucose-Capillary: 168 mg/dL — ABNORMAL HIGH (ref 65–99)
Glucose-Capillary: 179 mg/dL — ABNORMAL HIGH (ref 65–99)

## 2017-04-19 SURGERY — CYSTOURETEROSCOPY, WITH STENT INSERTION
Anesthesia: General | Site: Ureter | Laterality: Right

## 2017-04-19 MED ORDER — DEXAMETHASONE SODIUM PHOSPHATE 10 MG/ML IJ SOLN
INTRAMUSCULAR | Status: AC
Start: 2017-04-19 — End: 2017-04-19
  Filled 2017-04-19: qty 1

## 2017-04-19 MED ORDER — ONDANSETRON HCL 4 MG/2ML IJ SOLN
INTRAMUSCULAR | Status: DC | PRN
  Administered 2017-04-19: 4 mg via INTRAVENOUS

## 2017-04-19 MED ORDER — OXYCODONE HCL 5 MG PO TABS: 5.0000 mg | ORAL_TABLET | Freq: Once | ORAL | Status: DC | PRN

## 2017-04-19 MED ORDER — HYDROMORPHONE HCL 1 MG/ML IJ SOLN: 0.2500 mg | INTRAMUSCULAR | Status: DC | PRN

## 2017-04-19 MED ORDER — 0.9 % SODIUM CHLORIDE (POUR BTL) OPTIME
TOPICAL | Status: DC | PRN
  Administered 2017-04-19: 1000 mL

## 2017-04-19 MED ORDER — PROMETHAZINE HCL 25 MG/ML IJ SOLN: 6.2500 mg | INTRAMUSCULAR | Status: DC | PRN

## 2017-04-19 MED ORDER — LIDOCAINE 2% (20 MG/ML) 5 ML SYRINGE
INTRAMUSCULAR | Status: DC | PRN
  Administered 2017-04-19: 100 mg via INTRAVENOUS

## 2017-04-19 MED ORDER — LACTATED RINGERS IV SOLN
INTRAVENOUS | Status: DC | PRN
  Administered 2017-04-19 (×2): via INTRAVENOUS

## 2017-04-19 MED ORDER — CEPHALEXIN 500 MG PO CAPS: 500.0000 mg | ORAL_CAPSULE | Freq: Two times a day (BID) | ORAL | 0 refills | Status: DC

## 2017-04-19 MED ORDER — PROPOFOL 10 MG/ML IV BOLUS
INTRAVENOUS | Status: DC | PRN
  Administered 2017-04-19: 150 mg via INTRAVENOUS

## 2017-04-19 MED ORDER — ONDANSETRON HCL 4 MG/2ML IJ SOLN
INTRAMUSCULAR | Status: AC
Start: 2017-04-19 — End: 2017-04-19
  Filled 2017-04-19: qty 2

## 2017-04-19 MED ORDER — LACTATED RINGERS IV SOLN: INTRAVENOUS | Status: DC

## 2017-04-19 MED ORDER — PROPOFOL 10 MG/ML IV BOLUS
INTRAVENOUS | Status: AC
  Filled 2017-04-19: qty 20

## 2017-04-19 MED ORDER — EPHEDRINE SULFATE-NACL 50-0.9 MG/10ML-% IV SOSY
PREFILLED_SYRINGE | INTRAVENOUS | Status: DC | PRN
  Administered 2017-04-19 (×4): 10 mg via INTRAVENOUS

## 2017-04-19 MED ORDER — LIDOCAINE 2% (20 MG/ML) 5 ML SYRINGE
INTRAMUSCULAR | Status: AC
Start: 2017-04-19 — End: 2017-04-19
  Filled 2017-04-19: qty 5

## 2017-04-19 MED ORDER — SODIUM CHLORIDE 0.9 % IR SOLN
Status: DC | PRN
  Administered 2017-04-19: 11000 mL

## 2017-04-19 MED ORDER — PHENYLEPHRINE 40 MCG/ML (10ML) SYRINGE FOR IV PUSH (FOR BLOOD PRESSURE SUPPORT)
PREFILLED_SYRINGE | INTRAVENOUS | Status: DC | PRN
Start: 2017-04-19 — End: 2017-04-19
  Administered 2017-04-19 (×3): 80 ug via INTRAVENOUS

## 2017-04-19 MED ORDER — FENTANYL CITRATE (PF) 100 MCG/2ML IJ SOLN
INTRAMUSCULAR | Status: AC
  Filled 2017-04-19: qty 2

## 2017-04-19 MED ORDER — PHENYLEPHRINE 40 MCG/ML (10ML) SYRINGE FOR IV PUSH (FOR BLOOD PRESSURE SUPPORT)
PREFILLED_SYRINGE | INTRAVENOUS | Status: AC
  Filled 2017-04-19: qty 10

## 2017-04-19 MED ORDER — DEXAMETHASONE SODIUM PHOSPHATE 10 MG/ML IJ SOLN
INTRAMUSCULAR | Status: DC | PRN
  Administered 2017-04-19: 10 mg via INTRAVENOUS

## 2017-04-19 MED ORDER — CEFAZOLIN SODIUM-DEXTROSE 2-4 GM/100ML-% IV SOLN
INTRAVENOUS | Status: AC
  Filled 2017-04-19: qty 100

## 2017-04-19 MED ORDER — OXYCODONE HCL 5 MG/5ML PO SOLN
5.0000 mg | Freq: Once | ORAL | Status: DC | PRN
  Filled 2017-04-19: qty 5

## 2017-04-19 MED ORDER — FENTANYL CITRATE (PF) 100 MCG/2ML IJ SOLN
INTRAMUSCULAR | Status: DC | PRN
  Administered 2017-04-19: 25 ug via INTRAVENOUS
  Administered 2017-04-19: 50 ug via INTRAVENOUS
  Administered 2017-04-19: 25 ug via INTRAVENOUS

## 2017-04-19 MED ORDER — CEFAZOLIN SODIUM-DEXTROSE 2-4 GM/100ML-% IV SOLN
2.0000 g | INTRAVENOUS | Status: AC
  Administered 2017-04-19: 2 g via INTRAVENOUS

## 2017-04-19 MED ORDER — IOHEXOL 300 MG/ML  SOLN
INTRAMUSCULAR | Status: DC | PRN
Start: 2017-04-19 — End: 2017-04-19
  Administered 2017-04-19: 16 mL via URETHRAL

## 2017-04-19 MED ORDER — OXYBUTYNIN CHLORIDE 5 MG PO TABS: 5.0000 mg | ORAL_TABLET | Freq: Three times a day (TID) | ORAL | 1 refills | Status: DC | PRN

## 2017-04-19 SURGICAL SUPPLY — 28 items
BAG URO CATCHER STRL LF (MISCELLANEOUS) ×3 IMPLANT
BASKET LASER NITINOL 1.9FR (BASKET) ×3 IMPLANT
BASKET ZERO TIP NITINOL 2.4FR (BASKET) IMPLANT
CATH FOLEY 2WAY SLVR 18FR 30CC (CATHETERS) ×3 IMPLANT
CATH INTERMIT  6FR 70CM (CATHETERS) IMPLANT
CLOTH BEACON ORANGE TIMEOUT ST (SAFETY) ×3 IMPLANT
COVER FOOTSWITCH UNIV (MISCELLANEOUS) IMPLANT
COVER SURGICAL LIGHT HANDLE (MISCELLANEOUS) ×3 IMPLANT
EXTRACTOR STONE NITINOL NGAGE (UROLOGICAL SUPPLIES) ×3 IMPLANT
FIBER LASER FLEXIVA 365 (UROLOGICAL SUPPLIES) IMPLANT
FIBER LASER TRAC TIP (UROLOGICAL SUPPLIES) ×3 IMPLANT
FORCEPS BASKET TRICEP GRSP 2.4 (BASKET) ×3 IMPLANT
GLOVE BIOGEL M 8.0 STRL (GLOVE) ×3 IMPLANT
GOWN STRL REUS W/ TWL XL LVL3 (GOWN DISPOSABLE) ×1 IMPLANT
GOWN STRL REUS W/TWL LRG LVL3 (GOWN DISPOSABLE) ×6 IMPLANT
GOWN STRL REUS W/TWL XL LVL3 (GOWN DISPOSABLE) ×2
GUIDEWIRE ANG ZIPWIRE 038X150 (WIRE) ×3 IMPLANT
GUIDEWIRE STR DUAL SENSOR (WIRE) ×3 IMPLANT
IV NS 1000ML (IV SOLUTION) ×2
IV NS 1000ML BAXH (IV SOLUTION) ×1 IMPLANT
MANIFOLD NEPTUNE II (INSTRUMENTS) ×3 IMPLANT
PACK CYSTO (CUSTOM PROCEDURE TRAY) ×3 IMPLANT
SHEATH ACCESS URETERAL 54CM (SHEATH) ×3 IMPLANT
SHEATH URETERAL 12FRX35CM (MISCELLANEOUS) ×3 IMPLANT
STENT URET 6FRX26 CONTOUR (STENTS) ×3 IMPLANT
SYR 10ML ECCENTRIC (SYRINGE) ×3 IMPLANT
TUBING CONNECTING 10 (TUBING) ×2 IMPLANT
TUBING CONNECTING 10' (TUBING) ×1

## 2017-04-19 NOTE — Anesthesia Postprocedure Evaluation (Signed)
Anesthesia Post Note  Patient: Mark Burgess  Procedure(s) Performed: CYSTOSCOPY WITH URETEROSCOPY AND STENT Removal (Right Ureter)     Patient location during evaluation: PACU Anesthesia Type: General Level of consciousness: awake and alert Pain management: pain level controlled Vital Signs Assessment: post-procedure vital signs reviewed and stable Respiratory status: spontaneous breathing, nonlabored ventilation and respiratory function stable Cardiovascular status: blood pressure returned to baseline and stable Postop Assessment: no apparent nausea or vomiting Anesthetic complications: no    Last Vitals:  Vitals:   04/19/17 1028 04/19/17 1041  BP: 125/70 130/65  Pulse: 75 80  Resp: 13 14  Temp: (!) 36.4 C 36.5 C  SpO2: 99% 99%    Last Pain:  Vitals:   04/19/17 1053  TempSrc:   PainSc: 0-No pain                 Lynda Rainwater

## 2017-04-19 NOTE — Anesthesia Preprocedure Evaluation (Signed)
Anesthesia Evaluation  Patient identified by MRN, date of birth, ID band Patient awake    Reviewed: Allergy & Precautions, NPO status , Patient's Chart, lab work & pertinent test results, reviewed documented beta blocker date and time   Airway Mallampati: II  TM Distance: >3 FB Neck ROM: Full    Dental no notable dental hx.    Pulmonary neg pulmonary ROS, COPD, former smoker,    Pulmonary exam normal breath sounds clear to auscultation       Cardiovascular hypertension, Pt. on medications and Pt. on home beta blockers + CAD, + Cardiac Stents and +CHF  negative cardio ROS Normal cardiovascular exam Rhythm:Regular Rate:Normal  Left ventricle: The cavity size was normal. There was mild   concentric hypertrophy. Systolic function was mildly to   moderately reduced. The estimated ejection fraction was in the   range of 40% to 45%. There is hypokinesis in the basal   anteroseptal, inferoseptal and inferior walls. Doppler parameters   are consistent with abnormal left ventricular relaxation (grade 1   diastolic dysfunction). - Aortic valve: Trileaflet; mildly thickened, mildly calcified   leaflets. - Ascending aorta: The ascending aorta was mildly dilated measuring   40 mm. - Mitral valve: There was mild regurgitation. - Left atrium: The atrium was severely dilated. - Right ventricle: Systolic function was normal. - Tricuspid valve: There was mild regurgitation. - Pulmonary arteries: Systolic pressure was within the normal   range. - Inferior vena cava: The vessel was normal in size. - Pericardium, extracardiac: There was no pericardial effusion   Neuro/Psych negative neurological ROS  negative psych ROS   GI/Hepatic negative GI ROS, Neg liver ROS, GERD  ,  Endo/Other  negative endocrine ROSdiabetes, Type 2, Oral Hypoglycemic Agents  Renal/GU negative Renal ROS  negative genitourinary   Musculoskeletal negative  musculoskeletal ROS (+)   Abdominal   Peds negative pediatric ROS (+)  Hematology negative hematology ROS (+)   Anesthesia Other Findings   Reproductive/Obstetrics negative OB ROS                             Anesthesia Physical  Anesthesia Plan  ASA: III  Anesthesia Plan: General   Post-op Pain Management:    Induction: Intravenous  PONV Risk Score and Plan: 2 and Ondansetron and Dexamethasone  Airway Management Planned: LMA  Additional Equipment:   Intra-op Plan:   Post-operative Plan: Extubation in OR  Informed Consent: I have reviewed the patients History and Physical, chart, labs and discussed the procedure including the risks, benefits and alternatives for the proposed anesthesia with the patient or authorized representative who has indicated his/her understanding and acceptance.   Dental advisory given  Plan Discussed with: CRNA and Surgeon  Anesthesia Plan Comments:         Anesthesia Quick Evaluation

## 2017-04-19 NOTE — Anesthesia Procedure Notes (Signed)
Procedure Name: LMA Insertion Date/Time: 04/19/2017 7:49 AM Performed by: April Colter D, CRNA Pre-anesthesia Checklist: Patient identified, Emergency Drugs available, Suction available and Patient being monitored Patient Re-evaluated:Patient Re-evaluated prior to induction Oxygen Delivery Method: Circle system utilized Preoxygenation: Pre-oxygenation with 100% oxygen Induction Type: IV induction Ventilation: Mask ventilation without difficulty LMA: LMA inserted LMA Size: 4.0 Tube type: Oral Number of attempts: 1 Placement Confirmation: ETT inserted through vocal cords under direct vision,  positive ETCO2 and breath sounds checked- equal and bilateral Tube secured with: Tape Dental Injury: Teeth and Oropharynx as per pre-operative assessment

## 2017-04-19 NOTE — H&P (Signed)
H&P  Chief Complaint: Retained stent  History of Present Illness: 76 yo male s/p right  URS with HLL and extraction of stone--stent was placed postoperatively. He was recently seen in followup for stent extraction. His stent was not seeen cystoscopically--it had migrated in ureter. HE presents now for cysto, right ureteroscopic stent extraction. He also has a large left lowet pole stone burden and will eventually need percutaneous mgmt of this.  Past Medical History:  Diagnosis Date  . Aortic stenosis    apparent bicuspid aortic valve  . CAD (coronary artery disease)    subtotal occlusion of mid left circumflex, treated successfully w percutaneous coronary intervention using a 3.0- x 16-mm drug eluting stent. Percent stenois was taken from 95-0 w TIMI 2 flow taken to TIMI 3 flow post percutaneous coronary intervention  . Diabetes mellitus    type II   . 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 40%  . Emphysema lung ([place])   . GERD (gastroesophageal reflux disease)   . History of kidney stones   . HTN (hypertension)    x8 years  . Myocardial infarction [facility])     Past Surgical History:  Procedure Laterality Date  . APPENDECTOMY    . CARDIAC CATHETERIZATION    . COLONOSCOPY WITH PROPOFOL N/A 03/27/2015   Procedure: COLONOSCOPY WITH PROPOFOL;  Surgeon: Manya Silvas, MD;  Location: [facility] ENDOSCOPY;  Service: Endoscopy;  Laterality: N/A;  . CYSTOSCOPY WITH STENT PLACEMENT Right 03/11/2017   Procedure: CYSTOSCOPY WITH STENT PLACEMENT;  Surgeon: Franchot Gallo, MD;  Location: [facility] ORS;  Service: Urology;  Laterality: Right;  . CYSTOSCOPY/RETROGRADE/URETEROSCOPY/STONE EXTRACTION WITH BASKET Right 03/11/2017   Procedure: CYSTOSCOPY/RETROGRADE/URETEROSCOPY/STONE EXTRACTION WITH BASKET;  Surgeon: Franchot Gallo, MD;  Location: [facility] ORS;  Service: Urology;  Laterality: Right;  . EXTRACORPOREAL SHOCK WAVE LITHOTRIPSY Right 02/25/2017   Procedure: RIGHT EXTRACORPOREAL SHOCK  WAVE LITHOTRIPSY (ESWL);  Surgeon: Cleon Gustin, MD;  Location: [facility] ORS;  Service: Urology;  Laterality: Right;  . HOLMIUM LASER APPLICATION Right 27/11/8240   Procedure: HOLMIUM LASER APPLICATION;  Surgeon: Franchot Gallo, MD;  Location: [facility] ORS;  Service: Urology;  Laterality: Right;  . [place], 2006  . TONSILLECTOMY      Home Medications:   Allergies: No Known Allergies  Family History  Problem Relation Age of Onset  . Heart disease Father        Died 86  . Healthy Mother     Social History:  reports that he quit smoking about 35 years ago. he has never used smokeless tobacco. He reports that he drinks alcohol. He reports that he does not use drugs.  ROS: A complete review of systems was performed.  All systems are negative except for pertinent findings as noted.  Physical Exam:  Vital signs in last 24 hours: Temp:  [97.9 F (36.6 C)] 97.9 F (36.6 C) (12/10 0534) Pulse Rate:  [87] 87 (12/10 0534) Resp:  [18] 18 (12/10 0534) BP: (139)/(71) 139/71 (12/10 0534) SpO2:  [98 %] 98 % (12/10 0534) Weight:  [92.1 kg (203 lb 2 oz)] 92.1 kg (203 lb 2 oz) (12/10 0534) Constitutional:  Alert and oriented, No acute distress Cardiovascular: Regular rate and rhythm, No JVD Respiratory: Normal respiratory effort, Lungs clear bilaterally GI: Abdomen is soft, nontender, nondistended, no abdominal masses Lymphatic: No lymphadenopathy Neurologic: Grossly intact, no focal deficits Psychiatric: Normal mood and affect  Laboratory Data:  Recent Labs    04/19/17 0545  WBC 7.8  HGB 11.7*  HCT 35.8*  PLT 190    Recent Labs    04/19/17 0545  NA 139  K 3.7  CL 105  GLUCOSE 189*  BUN 20  CALCIUM 9.9  CREATININE 0.91     Results for orders placed or performed during the hospital encounter of 04/19/17 (from the past 24 hour(s))  Glucose, capillary     Status: Abnormal   Collection Time: 04/19/17  5:30 AM  Result Value Ref Range   Glucose-Capillary 168 (H) 65  - 99 mg/dL   Comment 1 Notify RN    Comment 2 Document in Chart   Basic metabolic panel     Status: Abnormal   Collection Time: 04/19/17  5:45 AM  Result Value Ref Range   Sodium 139 135 - 145 mmol/L   Potassium 3.7 3.5 - 5.1 mmol/L   Chloride 105 101 - 111 mmol/L   CO2 26 22 - 32 mmol/L   Glucose, Bld 189 (H) 65 - 99 mg/dL   BUN 20 6 - 20 mg/dL   Creatinine, Ser 0.91 0.61 - 1.24 mg/dL   Calcium 9.9 8.9 - 10.3 mg/dL   GFR calc non Af Amer >60 >60 mL/min   GFR calc Af Amer >60 >60 mL/min   Anion gap 8 5 - 15  CBC     Status: Abnormal   Collection Time: 04/19/17  5:45 AM  Result Value Ref Range   WBC 7.8 4.0 - 10.5 K/uL   RBC 3.89 (L) 4.22 - 5.81 MIL/uL   Hemoglobin 11.7 (L) 13.0 - 17.0 g/dL   HCT 35.8 (L) 39.0 - 52.0 %   MCV 92.0 78.0 - 100.0 fL   MCH 30.1 26.0 - 34.0 pg   MCHC 32.7 30.0 - 36.0 g/dL   RDW 14.8 11.5 - 15.5 %   Platelets 190 150 - 400 K/uL   No results found for this or any previous visit (from the past 240 hour(s)).  Renal Function: Recent Labs    04/19/17 0545  CREATININE 0.91   Estimated Creatinine Clearance: 82.8 mL/min (by C-G formula based on SCr of 0.91 mg/dL).  Radiologic Imaging: No results found.  Impression/Assessment:  Migrated right ureteral stent  Plan:  Cysto, right ureteral stent extraction.

## 2017-04-19 NOTE — Op Note (Signed)
Preoperative diagnosis: Migrated/retained right double-J stent, right ureteral and renal calculi  Postoperative diagnosis: Same  Principal procedure: Cystoscopy, right retrograde ureteropyelogram with fluoroscopic interpretation, right ureteroscopy with attempted extraction of right ureteral stent, right ureteral meatotomy using holmium laser, extraction of right ureteral stent, holmium laser lithotripsy of right ureteral calculi with extraction using semirigid and flexible ureteroscopy, replacement of 26 centimeter by 6 French contour double-J stent without tether  Surgeon: Damion Kant  Anesthesia: General with LMA  Complications: Difficult access to the ureter with inability to grasp double-J stent due to its location  Drains: Above mentioned stent  Specimen: Stone fragments  Indications: 76 year old male with a large right renal pelvic stone, recently treated with holmium laser lithotripsy using ureteroscopy.  Due to a large amount of bloody urine in the patient's renal pelvis at the time of his initial procedure, he has several fragments remaining.  Recent office visit to extract his double-J stent revealed it to be migrated proximally.  He presents at this time for extraction of his double-J stent as well as repeat ureteroscopy to the remove his ureteral stone burden.  He is aware of the risks and complications of the procedure which are primarily anesthetic related but also include infection, needing a repeat procedure, blood in the urine as well as inability to clear the right renal system of stones.  He desires to proceed.  Findings: Moderately obstructive bilobar prostatic hypertrophy.  Urothelium of the bladder was normal.  Ureteral orifices were normal in configuration and location.  The distal tip of the stent was slightly embedded in the distal ureter, making grasping the stent very difficult with 3-prong graspers or an engage basket.  There were several fairly large stone fragments in  the proximal ureter.  There was moderate proximal hydroureteronephrosis on retrograde study using Omnipaque.  Description of procedure: The patient was properly identified and marked in the holding area.  He received preoperative IV antibiotics.  He was taken to the operating room where general anesthetic was administered with the LMA.  He was placed in the dorsolithotomy position.  Genitalia and perineum were prepped and draped, proper timeout performed.  A 22 French panendoscope was advanced under direct vision through his urethra.  Findings above were noted.  The right ureter was cannulated with a 0.038 inch sensor tip guidewire.  It was advanced quite easily into the upper pole calyceal system using fluoroscopic guidance.  I then removed the cystoscope.  Using the rigid ureteroscope, I was able to negotiate into the distal ureter where the distal extent of the double-J stent was noted.  I was unable to grasp the tip of the stent with the engage basket, as it was embedded in the medial wall of the distal ureter.  Using 3 prong graspers, I was still unable to remove the stent due to its position in the ureter.  After significant length of time trying to extract this, I realized that he needed to have a ureteral meatotomy.  This was performed using a 200 m fiber and the holmium laser energy set at 15 Hz and at 1.0 J.  This was easily performed, and unroofed the distal end of the ureteral stent.  It was then grasped using cystoscopic graspers.  The guidewire was left in place.  I then removed the cystoscope, and replaced the semirigid ureteroscope.  The stones were fairly high in the ureter, and I had to replace the short semirigid ureteroscope with a longer one.  I then used the 200 m fiber  to apply a laser energy to the stone burden in the proximal ureter with a setting of 15 Hz and 0.8 J.  Multiple small fragments were produced.  I  was able to remove some of these with the engage basket and the semirigid  scope.  However, because of difficult access, I had to eventually place the medium length ureteral access catheter over top of the guidewire, and use the flexible dual-lumen digital ureteroscope to negotiate the proximal ureter, fragment the stone burden more, and extracted.  Following this, all of the ureteral stone burden was removed.  It was difficult to pass the long ureteral access catheter over the guidewire to access the renal stone burden, so at this point, I felt it prudent to terminate the procedure, and place a double-J stent with the intent of a third procedure down the road.  The guidewire was replaced.  The ureteral access catheter was removed.  The guidewire was backloaded into the cystoscope, and using fluoroscopic and cystoscopic guidance, the6 [place] by 26 cm contour double-J stent, with tether removed, was easily placed into the right ureter.  Excellent proximal and distal curls were seen, with a good curl seen within the bladder itself.  At this point, the cystoscope was removed.  Because of the length of the procedure and the moderate hypertrophy of the prostate, I felt it wise to place an 14 French Foley catheter.  This was hooked to a leg bag.  At this point, the procedure was terminated.  The patient was awakened and taken to the PACU in stable condition.

## 2017-04-19 NOTE — Transfer of Care (Signed)
Immediate Anesthesia Transfer of Care Note  Patient: Mark Burgess  Procedure(s) Performed: CYSTOSCOPY WITH URETEROSCOPY AND STENT Removal (Right Ureter)  Patient Location: PACU  Anesthesia Type:General  Level of Consciousness: awake, alert  and oriented  Airway & Oxygen Therapy: Patient Spontanous Breathing  Post-op Assessment: Report given to RN and Post -op Vital signs reviewed and stable  Post vital signs: Reviewed and stable  Last Vitals:  Vitals:   04/19/17 0534  BP: 139/71  Pulse: 87  Resp: 18  Temp: 36.6 C  SpO2: 98%    Last Pain:  Vitals:   04/19/17 0534  TempSrc: Oral         Complications: No apparent anesthesia complications

## 2017-04-19 NOTE — Discharge Instructions (Signed)
POSTOPERATIVE CARE AFTER URETEROSCOPY  Stent management  *Stents are often left in after ureteroscopy and stone treatment. If left in, they often cause urinary frequency, urgency, occasional blood in the urine, as well as flank discomfort with urination. These are all expected issues, and should resolve after the stent is removed.  IT IS OKAY TO REMOVE YOUR CATHETER AS INSTRUCTED ON TUESDAY MORNING. Cut the catheter where the nurse drew the dotted line. Stand in the shower when you are ready to remove it. Allow the catheter just to come out on its own..do not pull on it.  Diet  Once you have adequately recovered from anesthesia, you may gradually advance your diet, as tolerated, to your regular diet.  Activities  You may gradually increase your activities to your normal unrestricted level the day following your procedure.  Medications  You should resume all preoperative medications. If you are on aspirin-like compounds, you should not resume these until the blood clears from your urine. If given an antibiotic by the surgeon, take these until they are completed. You may also be given, if you have a stent, medications to decrease the urinary frequency and urgency.  Pain  After ureteroscopy, there may be some pain on the side of the scope. Take your pain medicine for this. Usually, this pain resolves within a day or 2.  Fever  Please report any fever over 100 to the doctor.     Indwelling Urinary Catheter Care, Adult Take good care of your catheter to keep it working and to prevent problems. How to wear your catheter Attach your catheter to your leg with tape (adhesive tape) or a leg strap. Make sure it is not too tight. If you use tape, remove any bits of tape that are already on the catheter. How to wear a drainage bag You should have:  A large overnight bag.    Overnight Bag You may wear the overnight bag at any time. Always keep the bag below the level of your bladder but  off the floor. When you sleep, put a clean plastic bag in a wastebasket. Then hang the bag inside the wastebasket.  How to care for your skin  Clean the skin around the catheter at least once every day.  Shower every day. Do not take baths.  Put creams, lotions, or ointments on your genital area only as told by your doctor.  Do not use powders, sprays, or lotions on your genital area. How to clean your catheter and your skin 1. Wash your hands with soap and water. 2. Wet a washcloth in warm water and gentle (mild) soap. 3. Use the washcloth to clean the skin where the catheter enters your body. Clean downward and wipe away from the catheter in small circles. Do not wipe toward the catheter. 4. Pat the area dry with a clean towel. Make sure to clean off all soap.  Emptying a drainage bag  Supplies Needed  Rubbing alcohol.  Gauze pad or cotton ball.  Tape or a leg strap.  Steps 1. Wash your hands with soap and water. 2. Separate (detach) the bag from your leg. 3. Hold the bag over the toilet or a clean container. Keep the bag below your hips and bladder. This stops pee (urine) from going back into the tube. 4. Open the pour spout at the bottom of the bag. 5. Empty the pee into the toilet or container. Do not let the pour spout touch any surface. 6. Put rubbing alcohol on  a gauze pad or cotton ball. 7. Use the gauze pad or cotton ball to clean the pour spout. 8. Close the pour spout. 9. Attach the bag to your leg with tape or a leg strap. 10. Wash your hands.  How to prevent infection and other problems  Never pull on your catheter or try to remove it. Pulling can damage tissue in your body.  Always wash your hands before and after touching your catheter.  If a leg strap gets wet, replace it with a dry one.  Drink enough fluids to keep your pee clear or pale yellow, or as told by your doctor.  Do not let the drainage bag or tubing touch the floor.  Wear cotton  underwear.  If you are male, wipe from front to back after you poop (have a bowel movement).  Check on the catheter often to make sure it works and the tubing is not twisted. Get help if:  Your pee is cloudy.  Your pee smells unusually bad.  Your pee is not draining into the bag.  Your tube gets clogged.  Your catheter starts to leak.  Your bladder feels full. Get help right away if:  You have redness, swelling, or pain where the catheter enters your body.  You have fluid, pus, or a bad smell coming from the area where the catheter enters your body.  The area where the catheter enters your body feels warm.  You have a fever.  You have pain in your: ? Stomach (abdomen). ? Legs. ? Lower back. ? Bladder.  You see blood fill the catheter.  Your pee is pink or red.  You feel sick to your stomach (nauseous).  You throw up (vomit).  You have chills.  Your catheter gets pulled out. This information is not intended to replace advice given to you by your health care provider. Make sure you discuss any questions you have with your health care provider. Document Released: 08/22/2012 Document Revised: 03/25/2016 Document Reviewed: 10/10/2013 Elsevier Interactive Patient Education  2018 [employer] Anesthesia, Adult, Care After These instructions provide you with information about caring for yourself after your procedure. Your health care provider may also give you more specific instructions. Your treatment has been planned according to current medical practices, but problems sometimes occur. Call your health care provider if you have any problems or questions after your procedure. What can I expect after the procedure? After the procedure, it is common to have:  Vomiting.  A sore throat.  Mental slowness.  It is common to feel:  Nauseous.  Cold or shivery.  Sleepy.  Tired.  Sore or achy, even in parts of your body where you did not have  surgery.  Follow these instructions at home: For at least 24 hours after the procedure:  Do not: ? Participate in activities where you could fall or become injured. ? Drive. ? Use heavy machinery. ? Drink alcohol. ? Take sleeping pills or medicines that cause drowsiness. ? Make important decisions or sign legal documents. ? Take care of children on your own.  Rest. Eating and drinking  If you vomit, drink water, juice, or soup when you can drink without vomiting.  Drink enough fluid to keep your urine clear or pale yellow.  Make sure you have little or no nausea before eating solid foods.  Follow the diet recommended by your health care provider. General instructions  Have a responsible adult stay with you until you are awake  and alert.  Return to your normal activities as told by your health care provider. Ask your health care provider what activities are safe for you.  Take over-the-counter and prescription medicines only as told by your health care provider.  If you smoke, do not smoke without supervision.  Keep all follow-up visits as told by your health care provider. This is important. Contact a health care provider if:  You continue to have nausea or vomiting at home, and medicines are not helpful.  You cannot drink fluids or start eating again.  You cannot urinate after 8-12 hours.  You develop a skin rash.  You have fever.  You have increasing redness at the site of your procedure. Get help right away if:  You have difficulty breathing.  You have chest pain.  You have unexpected bleeding.  You feel that you are having a life-threatening or urgent problem. This information is not intended to replace advice given to you by your health care provider. Make sure you discuss any questions you have with your health care provider. Document Released: 08/03/2000 Document Revised: 09/30/2015 Document Reviewed: 04/11/2015 Elsevier Interactive Patient Education   [employer].

## 2017-04-19 NOTE — Interval H&P Note (Signed)
History and Physical Interval Note:  04/19/2017 7:36 AM  Mark Burgess  has presented today for surgery, with the diagnosis of RETAINED URETERAL STENT  The various methods of treatment have been discussed with the patient and family. After consideration of risks, benefits and other options for treatment, the patient has consented to  Procedure(s): CYSTOSCOPY WITH URETEROSCOPY AND STENT Removal (Right) as a surgical intervention .  The patient's history has been reviewed, patient examined, no change in status, stable for surgery.  I have reviewed the patient's chart and labs.  Questions were answered to the patient's satisfaction.     Lillette Boxer Gearline Spilman

## 2017-04-20 ENCOUNTER — Encounter (HOSPITAL_COMMUNITY): Payer: Self-pay | Admitting: Urology

## 2017-06-18 ENCOUNTER — Other Ambulatory Visit: Payer: Self-pay | Admitting: Urology

## 2017-06-21 ENCOUNTER — Other Ambulatory Visit: Payer: Self-pay | Admitting: Urology

## 2017-06-21 DIAGNOSIS — N200 Calculus of kidney: Secondary | ICD-10-CM

## 2017-07-05 NOTE — Progress Notes (Addendum)
07-15-16 (Epic) ECHO  07-07-17 BMP routed to Dr. Diona Fanti for review

## 2017-07-05 NOTE — Patient Instructions (Addendum)
Carliss Quast [facility]  07/05/2017   Your procedure is scheduled on: 07-12-17  Report to [facility] Main  Entrance Have a seat in the [place]. Please note there is a phone at the [employer]. Please call [phone] on that phone. Someone from Short Stay will come and get you from the Main Lobby and take you to Short Stay.    Call this number if you have problems the morning of surgery [phone]   Remember: Do not eat food or drink liquids :After Midnight.     Take these medicines the morning of surgery with A SIP OF WATER: Allopurinol (Zyloprim), Amlodipine (Norvasc), Carvedilol (Coreg), and Omeprazole (Prilosec). You may also bring and use your inhaler as needed.                                You may not have any metal on your body including hair pins and              piercings  Do not wear jewelry, lotions, powders or deodorant             Men may shave face and neck.   Do not bring valuables to the hospital. [place].  Contacts, dentures or bridgework may not be worn into surgery.  Leave suitcase in the car. After surgery it may be brought to your room.                Please read over the following fact sheets you were given: _____________________________________________________________________           How to Manage Your Diabetes Before and After Surgery  Why is it important to control my blood sugar before and after surgery? . Improving blood sugar levels before and after surgery helps healing and can limit problems. . A way of improving blood sugar control is eating a healthy diet by: o  Eating less sugar and carbohydrates o  Increasing activity/exercise o  Talking with your doctor about reaching your blood sugar goals . High blood sugars (greater than 180 mg/dL) can raise your risk of infections and slow your recovery, so you will need to focus on controlling your diabetes  during the weeks before surgery. . Make sure that the doctor who takes care of your diabetes knows about your planned surgery including the date and location.  How do I manage my blood sugar before surgery? . Check your blood sugar at least 4 times a day, starting 2 days before surgery, to make sure that the level is not too high or low. o Check your blood sugar the morning of your surgery when you wake up and every 2 hours until you get to the Short Stay unit. . If your blood sugar is less than 70 mg/dL, you will need to treat for low blood sugar: o Do not take insulin. o Treat a low blood sugar (less than 70 mg/dL) with  cup of clear juice (cranberry or apple), 4 glucose tablets, OR glucose gel. o Recheck blood sugar in 15 minutes after treatment (to make sure it is greater than 70 mg/dL). If your blood sugar is not greater than 70 mg/dL on recheck, call [phone] for further instructions. . Report your  blood sugar to the short stay nurse when you get to Short Stay.  . If you are admitted to the hospital after surgery: o Your blood sugar will be checked by the staff and you will probably be given insulin after surgery (instead of oral diabetes medicines) to make sure you have good blood sugar levels. o The goal for blood sugar control after surgery is 80-180 mg/dL.   WHAT DO I DO ABOUT MY DIABETES MEDICATION?  Marland Kitchen Do not take oral diabetes medicines (pills) the morning of surgery.  . THE DAY BEFORE SURGERY, take your usual dose of Metformin        Patient Signature:  Date:   Nurse Signature:  Date:   Reviewed and Endorsed by [facility] Patient Education Committee, August 2015   [facility] - Preparing for Surgery Before surgery, you can play an important role.  Because skin is not sterile, your skin needs to be as free of germs as possible.  You can reduce the number of germs on your skin by washing with CHG (chlorahexidine gluconate) soap before surgery.  CHG is an antiseptic  cleaner which kills germs and bonds with the skin to continue killing germs even after washing. Please DO NOT use if you have an allergy to CHG or antibacterial soaps.  If your skin becomes reddened/irritated stop using the CHG and inform your nurse when you arrive at Short Stay. Do not shave (including legs and underarms) for at least 48 hours prior to the first CHG shower.  You may shave your face/neck. Please follow these instructions carefully:  1.  Shower with CHG Soap the night before surgery and the  morning of Surgery.  2.  If you choose to wash your hair, wash your hair first as usual with your  normal  shampoo.  3.  After you shampoo, rinse your hair and body thoroughly to remove the  shampoo.                           4.  Use CHG as you would any other liquid soap.  You can apply chg directly  to the skin and wash                       Gently with a scrungie or clean washcloth.  5.  Apply the CHG Soap to your body ONLY FROM THE NECK DOWN.   Do not use on face/ open                           Wound or open sores. Avoid contact with eyes, ears mouth and genitals (private parts).                       Wash face,  Genitals (private parts) with your normal soap.             6.  Wash thoroughly, paying special attention to the area where your surgery  will be performed.  7.  Thoroughly rinse your body with warm water from the neck down.  8.  DO NOT shower/wash with your normal soap after using and rinsing off  the CHG Soap.                9.  Pat yourself dry with a clean towel.            10.  Wear clean pajamas.            11.  Place clean sheets on your bed the night of your first shower and do not  sleep with pets. Day of Surgery : Do not apply any lotions/deodorants the morning of surgery.  Please wear clean clothes to the hospital/surgery center.  FAILURE TO FOLLOW THESE INSTRUCTIONS MAY RESULT IN THE CANCELLATION OF YOUR SURGERY PATIENT  SIGNATURE_________________________________  NURSE SIGNATURE__________________________________  ________________________________________________________________________

## 2017-07-06 ENCOUNTER — Encounter (HOSPITAL_COMMUNITY): Payer: Self-pay

## 2017-07-06 ENCOUNTER — Encounter (HOSPITAL_COMMUNITY)
Admission: RE | Admit: 2017-07-06 | Discharge: 2017-07-06 | Disposition: A | Discharge status: Home / Self Care | Payer: Medicare HMO | Source: Ambulatory Visit | Attending: Urology | Admitting: Urology

## 2017-07-06 ENCOUNTER — Other Ambulatory Visit: Payer: Self-pay

## 2017-07-06 ENCOUNTER — Other Ambulatory Visit: Payer: Self-pay | Admitting: Cardiology

## 2017-07-06 DIAGNOSIS — Z7982 Long term (current) use of aspirin: Secondary | ICD-10-CM | POA: Insufficient documentation

## 2017-07-06 DIAGNOSIS — E119 Type 2 diabetes mellitus without complications: Secondary | ICD-10-CM | POA: Diagnosis not present

## 2017-07-06 DIAGNOSIS — N200 Calculus of kidney: Secondary | ICD-10-CM | POA: Diagnosis present

## 2017-07-06 DIAGNOSIS — Z955 Presence of coronary angioplasty implant and graft: Secondary | ICD-10-CM | POA: Diagnosis not present

## 2017-07-06 DIAGNOSIS — I10 Essential (primary) hypertension: Secondary | ICD-10-CM | POA: Diagnosis not present

## 2017-07-06 DIAGNOSIS — I252 Old myocardial infarction: Secondary | ICD-10-CM | POA: Insufficient documentation

## 2017-07-06 DIAGNOSIS — I2510 Atherosclerotic heart disease of native coronary artery without angina pectoris: Secondary | ICD-10-CM | POA: Diagnosis not present

## 2017-07-06 DIAGNOSIS — Z7984 Long term (current) use of oral hypoglycemic drugs: Secondary | ICD-10-CM | POA: Insufficient documentation

## 2017-07-06 DIAGNOSIS — I350 Nonrheumatic aortic (valve) stenosis: Secondary | ICD-10-CM | POA: Insufficient documentation

## 2017-07-06 DIAGNOSIS — J439 Emphysema, unspecified: Secondary | ICD-10-CM | POA: Diagnosis not present

## 2017-07-06 DIAGNOSIS — K219 Gastro-esophageal reflux disease without esophagitis: Secondary | ICD-10-CM | POA: Diagnosis not present

## 2017-07-06 DIAGNOSIS — Z87891 Personal history of nicotine dependence: Secondary | ICD-10-CM | POA: Insufficient documentation

## 2017-07-06 LAB — BASIC METABOLIC PANEL
Anion gap: 11 (ref 5–15)
BUN: 30 mg/dL — AB (ref 6–20)
CALCIUM: 10.2 mg/dL (ref 8.9–10.3)
CO2: 22 mmol/L (ref 22–32)
CREATININE: 1.07 mg/dL (ref 0.61–1.24)
Chloride: 107 mmol/L (ref 101–111)
GLUCOSE: 212 mg/dL — AB (ref 65–99)
Potassium: 3.9 mmol/L (ref 3.5–5.1)
SODIUM: 140 mmol/L (ref 135–145)

## 2017-07-06 LAB — GLUCOSE, CAPILLARY: Glucose-Capillary: 211 mg/dL — ABNORMAL HIGH (ref 65–99)

## 2017-07-06 LAB — ABO/RH: ABO/RH(D): A POS

## 2017-07-06 LAB — CBC
HEMATOCRIT: 39.5 % (ref 39.0–52.0)
HEMOGLOBIN: 12.9 g/dL — AB (ref 13.0–17.0)
MCH: 29.8 pg (ref 26.0–34.0)
MCHC: 32.7 g/dL (ref 30.0–36.0)
MCV: 91.2 fL (ref 78.0–100.0)
Platelets: 191 10*3/uL (ref 150–400)
RBC: 4.33 MIL/uL (ref 4.22–5.81)
RDW: 14 % (ref 11.5–15.5)
WBC: 8.2 10*3/uL (ref 4.0–10.5)

## 2017-07-06 LAB — HEMOGLOBIN A1C
Hgb A1c MFr Bld: 7.1 % — ABNORMAL HIGH (ref 4.8–5.6)
Mean Plasma Glucose: 157.07 mg/dL

## 2017-07-07 NOTE — Progress Notes (Signed)
Final EKG result discussed with Dr. Ola Spurr, Anesthesiologist. Pt can proceed with surgery.

## 2017-07-08 ENCOUNTER — Other Ambulatory Visit: Payer: Self-pay | Admitting: Radiology

## 2017-07-09 ENCOUNTER — Ambulatory Visit (HOSPITAL_COMMUNITY)
Admission: RE | Admit: 2017-07-09 | Discharge: 2017-07-09 | Disposition: A | Discharge status: Home / Self Care | Payer: Medicare HMO | Source: Ambulatory Visit | Attending: Urology | Admitting: Urology

## 2017-07-09 ENCOUNTER — Encounter (HOSPITAL_COMMUNITY): Payer: Self-pay

## 2017-07-09 DIAGNOSIS — N200 Calculus of kidney: Secondary | ICD-10-CM

## 2017-07-09 HISTORY — PX: IR URETERAL STENT LEFT NEW ACCESS W/O SEP NEPHROSTOMY CATH: IMG6075

## 2017-07-09 LAB — GLUCOSE, CAPILLARY: GLUCOSE-CAPILLARY: 166 mg/dL — AB (ref 65–99)

## 2017-07-09 LAB — CBC WITH DIFFERENTIAL/PLATELET
Basophils Absolute: 0 10*3/uL (ref 0.0–0.1)
Basophils Relative: 0 %
Eosinophils Absolute: 0.2 10*3/uL (ref 0.0–0.7)
Eosinophils Relative: 3 %
HEMATOCRIT: 40.3 % (ref 39.0–52.0)
Hemoglobin: 13.4 g/dL (ref 13.0–17.0)
LYMPHS PCT: 35 %
Lymphs Abs: 2.7 10*3/uL (ref 0.7–4.0)
MCH: 29.5 pg (ref 26.0–34.0)
MCHC: 33.3 g/dL (ref 30.0–36.0)
MCV: 88.8 fL (ref 78.0–100.0)
MONO ABS: 0.8 10*3/uL (ref 0.1–1.0)
Monocytes Relative: 10 %
NEUTROS ABS: 4 10*3/uL (ref 1.7–7.7)
Neutrophils Relative %: 52 %
Platelets: 173 10*3/uL (ref 150–400)
RBC: 4.54 MIL/uL (ref 4.22–5.81)
RDW: 13.8 % (ref 11.5–15.5)
WBC: 7.7 10*3/uL (ref 4.0–10.5)

## 2017-07-09 LAB — PROTIME-INR
INR: 0.95
Prothrombin Time: 12.6 seconds (ref 11.4–15.2)

## 2017-07-09 MED ORDER — CEFAZOLIN SODIUM-DEXTROSE 2-4 GM/100ML-% IV SOLN
INTRAVENOUS | Status: AC
  Filled 2017-07-09: qty 100

## 2017-07-09 MED ORDER — LIDOCAINE HCL (PF) 1 % IJ SOLN
INTRAMUSCULAR | Status: AC | PRN
  Administered 2017-07-09: 20 mL

## 2017-07-09 MED ORDER — MIDAZOLAM HCL 2 MG/2ML IJ SOLN
INTRAMUSCULAR | Status: AC | PRN
  Administered 2017-07-09 (×2): 1 mg via INTRAVENOUS

## 2017-07-09 MED ORDER — IOPAMIDOL (ISOVUE-370) INJECTION 76%
INTRAVENOUS | Status: AC
  Administered 2017-07-09: 75 mL via INTRAVENOUS
  Filled 2017-07-09: qty 100

## 2017-07-09 MED ORDER — MIDAZOLAM HCL 2 MG/2ML IJ SOLN
INTRAMUSCULAR | Status: AC
  Filled 2017-07-09: qty 4

## 2017-07-09 MED ORDER — IOPAMIDOL (ISOVUE-370) INJECTION 76%
75.0000 mL | Freq: Once | INTRAVENOUS | Status: AC | PRN
  Administered 2017-07-09: 75 mL via INTRAVENOUS

## 2017-07-09 MED ORDER — CEFAZOLIN SODIUM-DEXTROSE 2-4 GM/100ML-% IV SOLN
2.0000 g | Freq: Once | INTRAVENOUS | Status: AC
  Administered 2017-07-09: 2 g via INTRAVENOUS

## 2017-07-09 MED ORDER — SODIUM CHLORIDE 0.9 % IV SOLN
INTRAVENOUS | Status: DC
  Administered 2017-07-09: 10:00:00 via INTRAVENOUS

## 2017-07-09 MED ORDER — FENTANYL CITRATE (PF) 100 MCG/2ML IJ SOLN
INTRAMUSCULAR | Status: AC
  Filled 2017-07-09: qty 2

## 2017-07-09 MED ORDER — IOPAMIDOL (ISOVUE-300) INJECTION 61%
INTRAVENOUS | Status: AC
Start: 2017-07-09 — End: 2017-07-09
  Filled 2017-07-09: qty 50

## 2017-07-09 MED ORDER — FENTANYL CITRATE (PF) 100 MCG/2ML IJ SOLN
INTRAMUSCULAR | Status: AC | PRN
  Administered 2017-07-09 (×2): 50 ug via INTRAVENOUS

## 2017-07-09 MED ORDER — LIDOCAINE HCL (PF) 1 % IJ SOLN
INTRAMUSCULAR | Status: AC
  Filled 2017-07-09: qty 30

## 2017-07-09 MED ORDER — IOPAMIDOL (ISOVUE-300) INJECTION 61%
25.0000 mL | Freq: Once | INTRAVENOUS | Status: AC | PRN
Start: 2017-07-09 — End: 2017-07-09
  Administered 2017-07-09: 10 mL

## 2017-07-09 NOTE — Procedures (Signed)
Interventional Radiology Procedure Note  Procedure: Left ureteral catheter   Complications: None  Estimated Blood Loss: < 10 mL  Findings: 5 Fr catheter placed via LP access around LP calculus and down ureter into bladder for PCNL access on Monday.  Venetia Night. Kathlene Cote, M.D Pager:  [phone]

## 2017-07-09 NOTE — Discharge Instructions (Signed)
Keep dressing in place until procedure on Monday. Keep dressing covered if you bathe.  Percutaneous Nephrostomy Home Guide Percutaneous nephrostomy is a procedure to insert a flexible tube into your kidney so that urine can leave your body. This procedure may be done if a medical condition prevents urine from leaving your kidney in the usual way. During the procedure, the nephrostomy tube is inserted in the right or left side of your lower back and is connected to an external drainage bag. After you have a nephrostomy tube placed, urine will collect in the drainage bag outside of your body. You will need to empty and change the drainage bag as needed. You will also need to take steps to care for the area where the nephrostomy tube was inserted (tube insertion site). How do I care for my nephrostomy tube?  Always keep your tubing, the leg bag, or the bedside drainage bag below the level of your kidney so that your urine drains freely.  Avoid activities that would cause bending or pulling of your tubing. Ask your health care provider what activities are safe for you.  When connecting your nephrostomy tube to a drainage bag, make sure that there are no kinks in the tubing and that your urine is draining freely. You may want to gently wrap an elastic bandage over the tubing. This will help keep the tubing in place and prevent it from kinking. Make sure there is no tension on the tubing so it does not become dislodged.  At night, you may want to connect your nephrostomy tube or the leg bag to a larger bedside drainage bag. How do I empty the drainage bag? Empty the leg bag or bedside drainage bag whenever it becomes ? full. Also empty it before you go to sleep. Most drainage bags have a drain at the bottom that allows urine to be emptied. Follow these basic steps: 1. Hold the drainage bag over a toilet or collection container. Use a measuring container if your health care provider told you to measure your  urine. 2. Open the drain of the bag and allow the urine to drain out. 3. After all the urine has drained from the drainage bag, close the drain fully. 4. Flush the urine down the toilet. If a collection container was used, rinse the container.  How do I change the dressing around the nephrostomy tube? Change your dressing and clean your tube exit site as told by your health care provider. You may need to change the dressing every day for the first 2 weeks after having a nephrostomy tube inserted. After the first 2 weeks, you may be told to change the dressing two times a week. Supplies needed:  Mild soap and water.  Split gauze pads, 4  4 inches (10 x 10 cm).  Gauze pads, 4  4 inches (10 x 10 cm).  Paper tape. How to change the dressing: Because of the location of your nephrostomy tube, you may need help from another person to complete dressing changes. Follow these basic steps: 1. Wash hands with soap and water. 2. Gently remove the tape and dressing from around the nephrostomy tube. Be careful not to pull on the tube while removing the dressing. Avoid using scissors because they may damage the tube. 3. Wash the skin around the tube with mild soap and water, rinse well, and pat the skin dry with a clean cloth. 4. Check the skin around the drain for redness, swelling, pus, warmth, or a  bad smell. 5. If the drain was sutured to the skin, check the suture to verify that it is still anchored in the skin. 6. Place two split gauze pads in and around the tube exit site. Do not apply ointments or alcohol to the site. 7. Place a gauze pad on top of the split gauze pad. 8. Coil the tube on top of the gauze. The tubing should rest on the gauze, not on the skin. 9. Place tape around each edge of the gauze pad. 10. Secure the nephrostomy tubing. Make sure that the tube does not kink or become pinched. The tubing should rest on the gauze pad, not on the skin. 11. Dispose of used supplies  properly.  How do I flush my nephrostomy tube? Use a saline syringe to rinse out (flush) your nephrostomy tube as told by your health care provider. Flushing is easier if a three-way stopcock is placed between the tube and the drainage bag. One connection of the stopcock connects to your tube, the second connects to the drainage bag, and the third is usually covered with a cap. The lever on the stopcock points to the direction on the stopcock that is closed to flow. Normally, the lever points in the direction of the cap to allow urine to drain from the tube to the drainage bag. Supplies needed:  Rubbing alcohol wipe.  10 mL 0.9% saline syringe. How to flush the tube: 1. Move the lever of the three-way stopcock so it points toward the drainage bag. 2. Clean the cap with a rubbing alcohol wipe. 3. Screw the tip of a 10 mL 0.9% saline syringe onto the cap. 4. Using the syringe plunger, slowly push the 10 mL 0.9% saline in the syringe over 5-10 seconds. If resistance is met or pain occurs while pushing, stop pushing the saline. 5. Remove the syringe from the cap. 6. Return the stopcock lever to the usual position, pointing in the direction of the cap. 7. Dispose of used supplies properly. How do I replace the drainage bag? Replace the drainage bag, three-way stopcock, and any extension tubing as told by your health care provider. Make sure you always have an extra drainage bag and connecting tubing available. 1. Empty urine from your drainage bag. 2. Gather a new drainage bag, three-way stopcock, and any extension tubing. 3. Remove the drainage bag, three-way stopcock, and any extension tubing from the nephrostomy tube. 4. Attach the new leg bag or bedside drainage bag, three-way stopcock, and any extension tubing to the nephrostomy tube. 5. Dispose of the used drainage bag, three-way stopcock, and any extension.  Contact a health care provider if:  You have problems with any of the valves or  tubing.  You have persistent pain or soreness in your back.  You have more redness, swelling, or pain around your tube insertion site.  You have more fluid or blood coming from your tube insertion site.  Your tube insertion site feels warm to the touch.  You have pus or a bad smell coming from your tube insertion site.  You have increased urine output or you feel burning when urinating. Get help right away if:  You have pain in your abdomen during the first week.  You have chest pain or have trouble breathing.  You have a new appearance of blood in your urine.  You have a fever or chills.  You have back pain that is not relieved by your medicine.  You have decreased urine output.  Your nephrostomy tube comes out. This information is not intended to replace advice given to you by your health care provider. Make sure you discuss any questions you have with your health care provider. Document Released: 02/15/2013 Document Revised: 02/07/2016 Document Reviewed: 02/07/2016 Elsevier Interactive Patient Education  2018 [place]. Moderate Conscious Sedation, Adult, Care After These instructions provide you with information about caring for yourself after your procedure. Your health care provider may also give you more specific instructions. Your treatment has been planned according to current medical practices, but problems sometimes occur. Call your health care provider if you have any problems or questions after your procedure. What can I expect after the procedure? After your procedure, it is common:  To feel sleepy for several hours.  To feel clumsy and have poor balance for several hours.  To have poor judgment for several hours.  To vomit if you eat too soon.  Follow these instructions at home: For at least 24 hours after the procedure:   Do not: ? Participate in activities where you could fall or become injured. ? Drive. ? Use heavy machinery. ? Drink  alcohol. ? Take sleeping pills or medicines that cause drowsiness. ? Make important decisions or sign legal documents. ? Take care of children on your own.  Rest. Eating and drinking  Follow the diet recommended by your health care provider.  If you vomit: ? Drink water, juice, or soup when you can drink without vomiting. ? Make sure you have little or no nausea before eating solid foods. General instructions  Have a responsible adult stay with you until you are awake and alert.  Take over-the-counter and prescription medicines only as told by your health care provider.  If you smoke, do not smoke without supervision.  Keep all follow-up visits as told by your health care provider. This is important. Contact a health care provider if:  You keep feeling nauseous or you keep vomiting.  You feel light-headed.  You develop a rash.  You have a fever. Get help right away if:  You have trouble breathing. This information is not intended to replace advice given to you by your health care provider. Make sure you discuss any questions you have with your health care provider. Document Released: 02/15/2013 Document Revised: 09/30/2015 Document Reviewed: 08/17/2015 Elsevier Interactive Patient Education  [employer].

## 2017-07-09 NOTE — H&P (Signed)
Chief Complaint: Kidney stones  Referring Physician(s): [place]  Supervising Physician: Aletta Edouard  Patient Status: [facility] - Out-pt  History of Present Illness: Mark Burgess is a 77 y.o. male who underwent surgical intervention back on 03/11/2017 for right renal calculi.  He had a stent, ureteroscoopy, and ESWL.  He had repeat ureteroscopy on 04/19/2017 for management of a migrated stent.  He still has a stent in place.  He was seen by Dr. Diona Fanti on 06/14/2017.  He still has a large LEFT lower pole stone.  He is here today for percutaneous nephrolithotomy on the LEFT and will have surgical intervention on Monday, March 4.  He is NPO. No blood thinners.   Past Medical History:  Diagnosis Date  . Aortic stenosis    apparent bicuspid aortic valve  . CAD (coronary artery disease)    subtotal occlusion of mid left circumflex, treated successfully w percutaneous coronary intervention using a 3.0- x 16-mm drug eluting stent. Percent stenois was taken from 95-0 w TIMI 2 flow taken to TIMI 3 flow post percutaneous coronary intervention  . Diabetes mellitus    type II   . 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 40%  . Emphysema lung ([place])   . GERD (gastroesophageal reflux disease)   . History of kidney stones   . HTN (hypertension)    x8 years  . Myocardial infarction [facility])    2012    Past Surgical History:  Procedure Laterality Date  . APPENDECTOMY    . CARDIAC CATHETERIZATION    . COLONOSCOPY WITH PROPOFOL N/A 03/27/2015   Procedure: COLONOSCOPY WITH PROPOFOL;  Surgeon: Manya Silvas, MD;  Location: [facility] ENDOSCOPY;  Service: Endoscopy;  Laterality: N/A;  . CYSTOSCOPY WITH STENT PLACEMENT Right 03/11/2017   Procedure: CYSTOSCOPY WITH STENT PLACEMENT;  Surgeon: Franchot Gallo, MD;  Location: [facility] ORS;  Service: Urology;  Laterality: Right;  . CYSTOSCOPY WITH URETEROSCOPY AND STENT PLACEMENT Right 04/19/2017   Procedure:  CYSTOSCOPY WITH URETEROSCOPY AND STENT Removal;  Surgeon: Franchot Gallo, MD;  Location: [facility] ORS;  Service: Urology;  Laterality: Right;  . CYSTOSCOPY/RETROGRADE/URETEROSCOPY/STONE EXTRACTION WITH BASKET Right 03/11/2017   Procedure: CYSTOSCOPY/RETROGRADE/URETEROSCOPY/STONE EXTRACTION WITH BASKET;  Surgeon: Franchot Gallo, MD;  Location: [facility] ORS;  Service: Urology;  Laterality: Right;  . EXTRACORPOREAL SHOCK WAVE LITHOTRIPSY Right 02/25/2017   Procedure: RIGHT EXTRACORPOREAL SHOCK WAVE LITHOTRIPSY (ESWL);  Surgeon: Cleon Gustin, MD;  Location: [facility] ORS;  Service: Urology;  Laterality: Right;  . HOLMIUM LASER APPLICATION Right 78/06/9560   Procedure: HOLMIUM LASER APPLICATION;  Surgeon: Franchot Gallo, MD;  Location: [facility] ORS;  Service: Urology;  Laterality: Right;  . [place], 2006  . TONSILLECTOMY      Allergies: Patient has no known allergies.  Medications: Prior to Admission medications   Medication Sig Start Date End Date Taking? Authorizing Provider  ADVAIR HFA 115-21 MCG/ACT inhaler Inhale 2 puffs into the lungs every 12 (twelve) hours. (0900 & 2100) 06/14/16   [provider]  allopurinol (ZYLOPRIM) 300 MG tablet Take 300 mg by mouth daily. (0900)    [provider]  amLODipine (NORVASC) 5 MG tablet Take 5 mg by mouth daily. (0900)    [provider]  aspirin EC 81 MG tablet Take 81 mg by mouth daily. (0900)    [provider]  atorvastatin (LIPITOR) 80 MG tablet Take 80 mg by mouth daily. (0900)    [provider]  carvedilol (COREG) 12.5 MG tablet  Take 1 tablet (12.5 mg total) by mouth 2 (two) times daily. Patient taking differently: Take 12.5 mg by mouth 2 (two) times daily. (0900 &2100) 06/22/11   Minus Breeding, MD  cephALEXin (KEFLEX) 500 MG capsule Take 1 capsule (500 mg total) by mouth 2 (two) times daily. Patient not taking: Reported on 06/28/2017 04/19/17   Franchot Gallo, MD  finasteride (PROSCAR) 5 MG  tablet Take 5 mg by mouth daily. (0900)    [provider]  latanoprost (XALATAN) 0.005 % ophthalmic solution Place 1 drop into both eyes at bedtime.  12/29/15   [provider]  lisinopril-hydrochlorothiazide (PRINZIDE,ZESTORETIC) 20-12.5 MG per tablet Take 1 tablet by mouth daily. (0900)    [provider]  metFORMIN (GLUCOPHAGE-XR) 500 MG 24 hr tablet Take 500 mg by mouth 2 (two) times daily. 0900 & 2100 12/13/16   [provider]  Multiple Vitamins-Minerals (CENTRUM MEN PO) Take 1 tablet by mouth daily. (0900)    [provider]  nitroGLYCERIN (NITROSTAT) 0.4 MG SL tablet PLACE 1 TABLET (0.4 MG TOTAL) UNDER THE TONGUE EVERY 5 (FIVE) MINUTES AS NEEDED. 07/06/17   Minus Breeding, MD  omeprazole (PRILOSEC) 40 MG capsule Take 40 mg by mouth daily. (0900)    [provider]  oxybutynin (DITROPAN) 5 MG tablet Take 1 tablet (5 mg total) by mouth every 8 (eight) hours as needed for bladder spasms. You have this medication at home. Patient not taking: Reported on 06/28/2017 04/19/17   Franchot Gallo, MD  spironolactone (ALDACTONE) 25 MG tablet Take 25 mg by mouth daily. (0900)    [provider]  traMADol (ULTRAM) 50 MG tablet Take 1 tablet (50 mg total) by mouth every 6 (six) hours as needed. Patient not taking: Reported on 04/13/2017 03/11/17   Franchot Gallo, MD     Family History  Problem Relation Age of Onset  . Heart disease Father        Died 86  . Healthy Mother     Social History   Socioeconomic History  . Marital status: Married    Spouse name: None  . Number of children: 1  . Years of education: None  . Highest education level: None  Social Needs  . Financial resource strain: None  . Food insecurity - worry: None  . Food insecurity - inability: None  . Transportation needs - medical: None  . Transportation needs - non-medical: None  Occupational History    Employer: RETIRED  Tobacco Use  . Smoking status:  Former Smoker    Last attempt to quit: 09/14/1981    Years since quitting: 35.8  . Smokeless tobacco: Never Used  . Tobacco comment: 1 ppd x 15 years, quit 30 years ago   Substance and Sexual Activity  . Alcohol use: Yes    Comment: occ  . Drug use: No  . Sexual activity: None  Other Topics Concern  . None  Social History Narrative   Married.     Review of Systems: A 12 point ROS discussedReview of Systems  Constitutional: Negative.   HENT: Negative.   Respiratory: Negative.   Cardiovascular: Negative.   Gastrointestinal: Negative.   Genitourinary: Positive for flank pain.  Skin: Negative.   Neurological: Negative.   Hematological: Negative.   Psychiatric/Behavioral: Negative.    Physical Exam  Constitutional: He is oriented to person, place, and time. He appears well-developed.  HENT:  Head: Normocephalic and atraumatic.  Eyes: EOM are normal.  Neck: Normal range of motion.  Cardiovascular: Normal  rate, regular rhythm and normal heart sounds.  Pulmonary/Chest: Effort normal and breath sounds normal. No respiratory distress.  Abdominal: Soft. He exhibits no distension.  Musculoskeletal: Normal range of motion.  Neurological: He is alert and oriented to person, place, and time.  Skin: Skin is warm and dry.  Psychiatric: He has a normal mood and affect. His behavior is normal. Judgment and thought content normal.  Vitals reviewed.   Imaging: No results found.  Labs:  CBC: Recent Labs    03/11/17 0748 04/19/17 0545 07/06/17 1347  WBC 9.2 7.8 8.2  HGB 11.0* 11.7* 12.9*  HCT 33.6* 35.8* 39.5  PLT 310 190 191    COAGS: No results for input(s): INR, APTT in the last 8760 hours.  BMP: Recent Labs    03/11/17 0748 04/19/17 0545 07/06/17 1347  NA 140 139 140  K 4.4 3.7 3.9  CL 107 105 107  CO2 24 26 22   GLUCOSE 189* 189* 212*  BUN 39* 20 30*  CALCIUM 10.7* 9.9 10.2  CREATININE 1.64* 0.91 1.07  GFRNONAA 40* >60 >60  GFRAA 46* >60 >60    LIVER  FUNCTION TESTS: No results for input(s): BILITOT, AST, ALT, ALKPHOS, PROT, ALBUMIN in the last 8760 hours.  TUMOR MARKERS: No results for input(s): AFPTM, CEA, CA199, CHROMGRNA in the last 8760 hours.  Assessment and Plan:  Large left lower pole renal calculi  Will proceed today with percutaneous nephrolithotomy by Dr. Kathlene Cote.  Risks and benefits of percutaneous nephrolithotomy were discussed with the patient including, but not limited to, infection, bleeding, significant bleeding causing loss or decrease in renal function or damage to adjacent structures.   All of the patient's questions were answered, patient is agreeable to proceed.  Consent signed and in chart.  Thank you for this interesting consult.  I greatly enjoyed meeting Mark Burgess and look forward to participating in their care.  A copy of this report was sent to the requesting provider on this date.  Electronically Signed: Murrell Redden, PA-C 07/09/2017, 9:50 AM   I spent a total of  30 Minutes in face to face in clinical consultation, greater than 50% of which was counseling/coordinating care for percutaneous nephrolithotomy.

## 2017-07-12 ENCOUNTER — Other Ambulatory Visit: Payer: Self-pay

## 2017-07-12 ENCOUNTER — Ambulatory Visit (HOSPITAL_COMMUNITY): Payer: Medicare HMO | Admitting: Anesthesiology

## 2017-07-12 ENCOUNTER — Encounter (HOSPITAL_COMMUNITY)
Admission: RE | Disposition: A | Discharge status: Home / Self Care | Payer: Self-pay | Source: Ambulatory Visit | Attending: Urology

## 2017-07-12 ENCOUNTER — Encounter (HOSPITAL_COMMUNITY): Payer: Self-pay

## 2017-07-12 ENCOUNTER — Observation Stay (HOSPITAL_COMMUNITY)
Admission: RE | Admit: 2017-07-12 | Discharge: 2017-07-13 | Disposition: A | Discharge status: Home / Self Care | Payer: Medicare HMO | Source: Ambulatory Visit | Attending: Urology | Admitting: Urology

## 2017-07-12 ENCOUNTER — Ambulatory Visit (HOSPITAL_COMMUNITY): Payer: Medicare HMO

## 2017-07-12 DIAGNOSIS — Z7982 Long term (current) use of aspirin: Secondary | ICD-10-CM | POA: Insufficient documentation

## 2017-07-12 DIAGNOSIS — Z955 Presence of coronary angioplasty implant and graft: Secondary | ICD-10-CM | POA: Insufficient documentation

## 2017-07-12 DIAGNOSIS — Z87891 Personal history of nicotine dependence: Secondary | ICD-10-CM | POA: Diagnosis not present

## 2017-07-12 DIAGNOSIS — E119 Type 2 diabetes mellitus without complications: Secondary | ICD-10-CM | POA: Insufficient documentation

## 2017-07-12 DIAGNOSIS — Z79899 Other long term (current) drug therapy: Secondary | ICD-10-CM | POA: Insufficient documentation

## 2017-07-12 DIAGNOSIS — Z7984 Long term (current) use of oral hypoglycemic drugs: Secondary | ICD-10-CM | POA: Insufficient documentation

## 2017-07-12 DIAGNOSIS — I509 Heart failure, unspecified: Secondary | ICD-10-CM | POA: Diagnosis not present

## 2017-07-12 DIAGNOSIS — I252 Old myocardial infarction: Secondary | ICD-10-CM | POA: Diagnosis not present

## 2017-07-12 DIAGNOSIS — N200 Calculus of kidney: Principal | ICD-10-CM | POA: Insufficient documentation

## 2017-07-12 DIAGNOSIS — I110 Hypertensive heart disease with heart failure: Secondary | ICD-10-CM | POA: Diagnosis not present

## 2017-07-12 DIAGNOSIS — I350 Nonrheumatic aortic (valve) stenosis: Secondary | ICD-10-CM | POA: Insufficient documentation

## 2017-07-12 DIAGNOSIS — I2510 Atherosclerotic heart disease of native coronary artery without angina pectoris: Secondary | ICD-10-CM | POA: Diagnosis not present

## 2017-07-12 DIAGNOSIS — Z87442 Personal history of urinary calculi: Secondary | ICD-10-CM | POA: Insufficient documentation

## 2017-07-12 DIAGNOSIS — Z8249 Family history of ischemic heart disease and other diseases of the circulatory system: Secondary | ICD-10-CM | POA: Insufficient documentation

## 2017-07-12 DIAGNOSIS — J439 Emphysema, unspecified: Secondary | ICD-10-CM | POA: Diagnosis not present

## 2017-07-12 DIAGNOSIS — K219 Gastro-esophageal reflux disease without esophagitis: Secondary | ICD-10-CM | POA: Insufficient documentation

## 2017-07-12 HISTORY — PX: NEPHROLITHOTOMY: SHX5134

## 2017-07-12 LAB — GLUCOSE, CAPILLARY
Glucose-Capillary: 170 mg/dL — ABNORMAL HIGH (ref 65–99)
Glucose-Capillary: 176 mg/dL — ABNORMAL HIGH (ref 65–99)

## 2017-07-12 LAB — TYPE AND SCREEN
ABO/RH(D): A POS
ANTIBODY SCREEN: NEGATIVE

## 2017-07-12 SURGERY — NEPHROLITHOTOMY PERCUTANEOUS
Anesthesia: General | Laterality: Left

## 2017-07-12 MED ORDER — FENTANYL CITRATE (PF) 100 MCG/2ML IJ SOLN
INTRAMUSCULAR | Status: AC
  Filled 2017-07-12: qty 2

## 2017-07-12 MED ORDER — MOMETASONE FURO-FORMOTEROL FUM 200-5 MCG/ACT IN AERO
2.0000 | INHALATION_SPRAY | Freq: Two times a day (BID) | RESPIRATORY_TRACT | Status: DC
  Administered 2017-07-12 – 2017-07-13 (×2): 2 via RESPIRATORY_TRACT
  Filled 2017-07-12: qty 8.8

## 2017-07-12 MED ORDER — PANTOPRAZOLE SODIUM 40 MG PO TBEC
40.0000 mg | DELAYED_RELEASE_TABLET | Freq: Every day | ORAL | Status: DC
  Filled 2017-07-12: qty 1

## 2017-07-12 MED ORDER — FENTANYL CITRATE (PF) 100 MCG/2ML IJ SOLN
INTRAMUSCULAR | Status: DC | PRN
  Administered 2017-07-12: 25 ug via INTRAVENOUS

## 2017-07-12 MED ORDER — LATANOPROST 0.005 % OP SOLN
1.0000 [drp] | Freq: Every day | OPHTHALMIC | Status: DC
  Administered 2017-07-12: 1 [drp] via OPHTHALMIC
  Filled 2017-07-12: qty 2.5

## 2017-07-12 MED ORDER — OXYCODONE HCL 5 MG PO TABS: 5.0000 mg | ORAL_TABLET | ORAL | 0 refills | Status: DC | PRN

## 2017-07-12 MED ORDER — SUCCINYLCHOLINE CHLORIDE 200 MG/10ML IV SOSY
PREFILLED_SYRINGE | INTRAVENOUS | Status: AC
  Filled 2017-07-12: qty 10

## 2017-07-12 MED ORDER — PROMETHAZINE HCL 25 MG/ML IJ SOLN: 6.2500 mg | INTRAMUSCULAR | Status: DC | PRN

## 2017-07-12 MED ORDER — HYDROMORPHONE HCL 1 MG/ML IJ SOLN
0.5000 mg | INTRAMUSCULAR | Status: DC | PRN
  Administered 2017-07-12 (×3): 1 mg via INTRAVENOUS
  Filled 2017-07-12 (×4): qty 1

## 2017-07-12 MED ORDER — SPIRONOLACTONE 25 MG PO TABS
25.0000 mg | ORAL_TABLET | Freq: Every day | ORAL | Status: DC
  Administered 2017-07-12: 25 mg via ORAL
  Filled 2017-07-12: qty 1

## 2017-07-12 MED ORDER — ROCURONIUM BROMIDE 10 MG/ML (PF) SYRINGE
PREFILLED_SYRINGE | INTRAVENOUS | Status: AC
  Filled 2017-07-12: qty 5

## 2017-07-12 MED ORDER — AMLODIPINE BESYLATE 5 MG PO TABS: 5.0000 mg | ORAL_TABLET | Freq: Every day | ORAL | Status: DC

## 2017-07-12 MED ORDER — SUGAMMADEX SODIUM 200 MG/2ML IV SOLN
INTRAVENOUS | Status: AC
  Filled 2017-07-12: qty 2

## 2017-07-12 MED ORDER — PHENYLEPHRINE HCL 10 MG/ML IJ SOLN
INTRAMUSCULAR | Status: AC
  Filled 2017-07-12: qty 1

## 2017-07-12 MED ORDER — SODIUM CHLORIDE 0.45 % IV SOLN
INTRAVENOUS | Status: DC
  Administered 2017-07-12 – 2017-07-13 (×2): via INTRAVENOUS

## 2017-07-12 MED ORDER — ONDANSETRON HCL 4 MG/2ML IJ SOLN
INTRAMUSCULAR | Status: DC | PRN
  Administered 2017-07-12: 4 mg via INTRAVENOUS

## 2017-07-12 MED ORDER — ONDANSETRON HCL 4 MG/2ML IJ SOLN
INTRAMUSCULAR | Status: AC
  Filled 2017-07-12: qty 2

## 2017-07-12 MED ORDER — SUCCINYLCHOLINE CHLORIDE 200 MG/10ML IV SOSY
PREFILLED_SYRINGE | INTRAVENOUS | Status: DC | PRN
  Administered 2017-07-12: 100 mg via INTRAVENOUS

## 2017-07-12 MED ORDER — ROCURONIUM BROMIDE 10 MG/ML (PF) SYRINGE
PREFILLED_SYRINGE | INTRAVENOUS | Status: DC | PRN
  Administered 2017-07-12: 50 mg via INTRAVENOUS

## 2017-07-12 MED ORDER — PHENYLEPHRINE HCL 10 MG/ML IJ SOLN
INTRAVENOUS | Status: DC | PRN
  Administered 2017-07-12: 25 ug/min via INTRAVENOUS

## 2017-07-12 MED ORDER — PROPOFOL 10 MG/ML IV BOLUS
INTRAVENOUS | Status: DC | PRN
  Administered 2017-07-12: 140 mg via INTRAVENOUS

## 2017-07-12 MED ORDER — ALLOPURINOL 300 MG PO TABS: 300.0000 mg | ORAL_TABLET | Freq: Every day | ORAL | Status: DC

## 2017-07-12 MED ORDER — METFORMIN HCL ER 500 MG PO TB24
500.0000 mg | ORAL_TABLET | Freq: Two times a day (BID) | ORAL | Status: DC
  Administered 2017-07-12 (×2): 500 mg via ORAL
  Filled 2017-07-12 (×3): qty 1

## 2017-07-12 MED ORDER — LACTATED RINGERS IV SOLN
INTRAVENOUS | Status: DC | PRN
  Administered 2017-07-12: 07:00:00 via INTRAVENOUS

## 2017-07-12 MED ORDER — CEPHALEXIN 500 MG PO CAPS
500.0000 mg | ORAL_CAPSULE | Freq: Two times a day (BID) | ORAL | Status: DC
  Administered 2017-07-12: 500 mg via ORAL
  Filled 2017-07-12: qty 1

## 2017-07-12 MED ORDER — HYDROMORPHONE HCL 1 MG/ML IJ SOLN
INTRAMUSCULAR | Status: AC
  Administered 2017-07-12: 0.25 mg via INTRAVENOUS
  Filled 2017-07-12: qty 1

## 2017-07-12 MED ORDER — SUGAMMADEX SODIUM 200 MG/2ML IV SOLN
INTRAVENOUS | Status: DC | PRN
  Administered 2017-07-12: 200 mg via INTRAVENOUS

## 2017-07-12 MED ORDER — LIDOCAINE 2% (20 MG/ML) 5 ML SYRINGE
INTRAMUSCULAR | Status: AC
  Filled 2017-07-12: qty 5

## 2017-07-12 MED ORDER — OXYBUTYNIN CHLORIDE 5 MG PO TABS
5.0000 mg | ORAL_TABLET | Freq: Three times a day (TID) | ORAL | Status: DC | PRN
Start: 2017-07-12 — End: 2017-07-13
  Administered 2017-07-12: 5 mg via ORAL
  Filled 2017-07-12: qty 1

## 2017-07-12 MED ORDER — HYDROCHLOROTHIAZIDE 12.5 MG PO CAPS
12.5000 mg | ORAL_CAPSULE | Freq: Every day | ORAL | Status: DC
  Administered 2017-07-12: 12.5 mg via ORAL
  Filled 2017-07-12: qty 1

## 2017-07-12 MED ORDER — PROPOFOL 10 MG/ML IV BOLUS
INTRAVENOUS | Status: AC
Start: 2017-07-12 — End: ?
  Filled 2017-07-12: qty 20

## 2017-07-12 MED ORDER — IOHEXOL 300 MG/ML  SOLN
INTRAMUSCULAR | Status: DC | PRN
  Administered 2017-07-12: 20 mL

## 2017-07-12 MED ORDER — CEFAZOLIN SODIUM-DEXTROSE 2-4 GM/100ML-% IV SOLN
2.0000 g | INTRAVENOUS | Status: AC
  Administered 2017-07-12: 2 g via INTRAVENOUS
  Filled 2017-07-12: qty 100

## 2017-07-12 MED ORDER — OXYCODONE HCL 5 MG PO TABS: 5.0000 mg | ORAL_TABLET | ORAL | Status: DC | PRN

## 2017-07-12 MED ORDER — ATORVASTATIN CALCIUM 40 MG PO TABS
80.0000 mg | ORAL_TABLET | Freq: Every day | ORAL | Status: DC
  Administered 2017-07-12: 80 mg via ORAL
  Filled 2017-07-12: qty 2

## 2017-07-12 MED ORDER — HYDROMORPHONE HCL 1 MG/ML IJ SOLN
0.2500 mg | INTRAMUSCULAR | Status: DC | PRN
  Administered 2017-07-12: 0.25 mg via INTRAVENOUS
  Administered 2017-07-12: 0.5 mg via INTRAVENOUS
  Administered 2017-07-12: 0.25 mg via INTRAVENOUS

## 2017-07-12 MED ORDER — LISINOPRIL-HYDROCHLOROTHIAZIDE 20-12.5 MG PO TABS: 1.0000 | ORAL_TABLET | Freq: Every day | ORAL | Status: DC

## 2017-07-12 MED ORDER — SODIUM CHLORIDE 0.9 % IR SOLN
Status: DC | PRN
  Administered 2017-07-12: 12000 mL

## 2017-07-12 MED ORDER — FINASTERIDE 5 MG PO TABS
5.0000 mg | ORAL_TABLET | Freq: Every day | ORAL | Status: DC
  Administered 2017-07-12: 5 mg via ORAL
  Filled 2017-07-12: qty 1

## 2017-07-12 MED ORDER — ACETAMINOPHEN 325 MG PO TABS: 650.0000 mg | ORAL_TABLET | ORAL | Status: DC | PRN

## 2017-07-12 MED ORDER — LIDOCAINE 2% (20 MG/ML) 5 ML SYRINGE
INTRAMUSCULAR | Status: DC | PRN
  Administered 2017-07-12: 100 mg via INTRAVENOUS

## 2017-07-12 MED ORDER — SENNA 8.6 MG PO TABS
1.0000 | ORAL_TABLET | Freq: Two times a day (BID) | ORAL | Status: DC
  Administered 2017-07-12 (×2): 8.6 mg via ORAL
  Filled 2017-07-12 (×2): qty 1

## 2017-07-12 MED ORDER — LISINOPRIL 20 MG PO TABS
20.0000 mg | ORAL_TABLET | Freq: Every day | ORAL | Status: DC
  Administered 2017-07-12: 20 mg via ORAL
  Filled 2017-07-12: qty 1

## 2017-07-12 MED ORDER — CARVEDILOL 12.5 MG PO TABS
12.5000 mg | ORAL_TABLET | Freq: Two times a day (BID) | ORAL | Status: DC
  Administered 2017-07-12: 12.5 mg via ORAL
  Filled 2017-07-12: qty 1

## 2017-07-12 MED ORDER — NITROGLYCERIN 0.4 MG SL SUBL: 0.4000 mg | SUBLINGUAL_TABLET | SUBLINGUAL | Status: DC | PRN

## 2017-07-12 SURGICAL SUPPLY — 47 items
APL SKNCLS STERI-STRIP NONHPOA (GAUZE/BANDAGES/DRESSINGS) ×1
APPLICATOR SURGIFLO ENDO (HEMOSTASIS) ×3 IMPLANT
BAG URINE DRAINAGE (UROLOGICAL SUPPLIES) ×3 IMPLANT
BASKET ZERO TIP NITINOL 2.4FR (BASKET) ×3 IMPLANT
BENZOIN TINCTURE PRP APPL 2/3 (GAUZE/BANDAGES/DRESSINGS) ×3 IMPLANT
BLADE SURG 15 STRL LF DISP TIS (BLADE) ×1 IMPLANT
BLADE SURG 15 STRL SS (BLADE) ×2
CATH FOLEY 2W COUNCIL 20FR 5CC (CATHETERS) IMPLANT
CATH ROBINSON RED A/P 20FR (CATHETERS) IMPLANT
CATH X-FORCE N30 NEPHROSTOMY (TUBING) ×3 IMPLANT
COVER SURGICAL LIGHT HANDLE (MISCELLANEOUS) ×3 IMPLANT
DRAPE C-ARM 42X120 X-RAY (DRAPES) ×3 IMPLANT
DRAPE LINGEMAN PERC (DRAPES) ×3 IMPLANT
DRAPE SURG IRRIG POUCH 19X23 (DRAPES) ×3 IMPLANT
DRSG PAD ABDOMINAL 8X10 ST (GAUZE/BANDAGES/DRESSINGS) ×6 IMPLANT
DRSG TEGADERM 8X12 (GAUZE/BANDAGES/DRESSINGS) ×6 IMPLANT
FIBER LASER FLEXIVA 1000 (UROLOGICAL SUPPLIES) IMPLANT
FIBER LASER FLEXIVA 365 (UROLOGICAL SUPPLIES) IMPLANT
FIBER LASER FLEXIVA 550 (UROLOGICAL SUPPLIES) IMPLANT
FIBER LASER TRAC TIP (UROLOGICAL SUPPLIES) IMPLANT
FLOSEAL 10ML (HEMOSTASIS) ×3 IMPLANT
GAUZE SPONGE 4X4 12PLY STRL (GAUZE/BANDAGES/DRESSINGS) ×3 IMPLANT
GLOVE BIOGEL M 8.0 STRL (GLOVE) ×3 IMPLANT
GOWN STRL REUS W/TWL XL LVL3 (GOWN DISPOSABLE) ×3 IMPLANT
GUIDEWIRE AMPLAZ .035X145 (WIRE) ×6 IMPLANT
GUIDEWIRE STR DUAL SENSOR (WIRE) ×3 IMPLANT
KIT BASIN OR (CUSTOM PROCEDURE TRAY) ×3 IMPLANT
MANIFOLD NEPTUNE II (INSTRUMENTS) ×3 IMPLANT
NS IRRIG 1000ML POUR BTL (IV SOLUTION) IMPLANT
PACK CYSTO (CUSTOM PROCEDURE TRAY) ×3 IMPLANT
PROBE LITHOCLAST ULTRA 3.8X403 (UROLOGICAL SUPPLIES) ×3 IMPLANT
PROBE PNEUMATIC 1.0MMX570MM (UROLOGICAL SUPPLIES) ×3 IMPLANT
SET IRRIG Y TYPE TUR BLADDER L (SET/KITS/TRAYS/PACK) IMPLANT
SHEATH PEELAWAY SET 9 (SHEATH) ×3 IMPLANT
SPONGE LAP 4X18 X RAY DECT (DISPOSABLE) ×3 IMPLANT
STENT CONTOUR 6FRX28X.038 (STENTS) ×3 IMPLANT
STONE CATCHER W/TUBE ADAPTER (UROLOGICAL SUPPLIES) ×3 IMPLANT
SUT SILK 2 0 30  PSL (SUTURE) ×2
SUT SILK 2 0 30 PSL (SUTURE) ×1 IMPLANT
SYR 10ML LL (SYRINGE) ×3 IMPLANT
SYR 20CC LL (SYRINGE) ×6 IMPLANT
TAPE CLOTH SURG 4X10 WHT LF (GAUZE/BANDAGES/DRESSINGS) ×3 IMPLANT
TRAY FOLEY BAG SILVER LF 14FR (CATHETERS) IMPLANT
TRAY FOLEY W/METER SILVER 16FR (SET/KITS/TRAYS/PACK) ×3 IMPLANT
TUBING CONNECTING 10 (TUBING) ×4 IMPLANT
TUBING CONNECTING 10' (TUBING) ×2
WATER STERILE IRR 1000ML POUR (IV SOLUTION) IMPLANT

## 2017-07-12 NOTE — Anesthesia Preprocedure Evaluation (Signed)
Anesthesia Evaluation  Patient identified by MRN, date of birth, ID band Patient awake    Reviewed: Allergy & Precautions, NPO status , Patient's Chart, lab work & pertinent test results  Airway Mallampati: II  TM Distance: >3 FB Neck ROM: Full    Dental no notable dental hx.    Pulmonary neg pulmonary ROS, former smoker,    Pulmonary exam normal breath sounds clear to auscultation       Cardiovascular hypertension, + CAD, + Past MI, + Cardiac Stents and +CHF  Normal cardiovascular exam Rhythm:Regular Rate:Normal     Neuro/Psych negative neurological ROS  negative psych ROS   GI/Hepatic Neg liver ROS, GERD  ,  Endo/Other  diabetes  Renal/GU negative Renal ROS  negative genitourinary   Musculoskeletal negative musculoskeletal ROS (+)   Abdominal   Peds negative pediatric ROS (+)  Hematology negative hematology ROS (+)   Anesthesia Other Findings   Reproductive/Obstetrics negative OB ROS                             Anesthesia Physical Anesthesia Plan  ASA: III  Anesthesia Plan: General   Post-op Pain Management:    Induction: Intravenous  PONV Risk Score and Plan: 2 and Ondansetron, Treatment may vary due to age or medical condition and Dexamethasone  Airway Management Planned: Oral ETT  Additional Equipment:   Intra-op Plan:   Post-operative Plan: Extubation in OR  Informed Consent: I have reviewed the patients History and Physical, chart, labs and discussed the procedure including the risks, benefits and alternatives for the proposed anesthesia with the patient or authorized representative who has indicated his/her understanding and acceptance.   Dental advisory given  Plan Discussed with: CRNA and Surgeon  Anesthesia Plan Comments:         Anesthesia Quick Evaluation

## 2017-07-12 NOTE — Plan of Care (Signed)
  Education: Knowledge of General Education information will improve 07/12/2017 2021 - Progressing by Ashley Murrain, RN Activity: Risk for activity intolerance will decrease 07/12/2017 2021 - Progressing by Ashley Murrain, RN    Clinical Measurements: Ability to maintain clinical measurements within normal limits will improve 07/12/2017 2021 - Progressing by Ashley Murrain, RN   Clinical Measurements: Will remain free from infection 07/12/2017 2021 - Progressing by Ashley Murrain, RN

## 2017-07-12 NOTE — H&P (Signed)
H&P  Chief Complaint: Left kidney stone  History of Present Illness: 77 yo male presents for PCNL of a large left lower pole renal stone. He has a h/o urolithiasis, having had a lerge right stone burden managed with URS in 2018. On initial evaluation he was found to have a 19 mm lower pole stone on the left--he has been counseled in treatment options as well as attendant risks of each--he has chosen pcnl.  Past Medical History:  Diagnosis Date  . Aortic stenosis    apparent bicuspid aortic valve  . CAD (coronary artery disease)    subtotal occlusion of mid left circumflex, treated successfully w percutaneous coronary intervention using a 3.0- x 16-mm drug eluting stent. Percent stenois was taken from 95-0 w TIMI 2 flow taken to TIMI 3 flow post percutaneous coronary intervention  . Diabetes mellitus    type II   . 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 40%  . Emphysema lung ([place])   . GERD (gastroesophageal reflux disease)   . History of kidney stones   . HTN (hypertension)    x8 years  . Myocardial infarction [facility])    2012    Past Surgical History:  Procedure Laterality Date  . APPENDECTOMY    . CARDIAC CATHETERIZATION    . COLONOSCOPY WITH PROPOFOL N/A 03/27/2015   Procedure: COLONOSCOPY WITH PROPOFOL;  Surgeon: Manya Silvas, MD;  Location: [facility] ENDOSCOPY;  Service: Endoscopy;  Laterality: N/A;  . CYSTOSCOPY WITH STENT PLACEMENT Right 03/11/2017   Procedure: CYSTOSCOPY WITH STENT PLACEMENT;  Surgeon: Franchot Gallo, MD;  Location: [facility] ORS;  Service: Urology;  Laterality: Right;  . CYSTOSCOPY WITH URETEROSCOPY AND STENT PLACEMENT Right 04/19/2017   Procedure: CYSTOSCOPY WITH URETEROSCOPY AND STENT Removal;  Surgeon: Franchot Gallo, MD;  Location: [facility] ORS;  Service: Urology;  Laterality: Right;  . CYSTOSCOPY/RETROGRADE/URETEROSCOPY/STONE EXTRACTION WITH BASKET Right 03/11/2017   Procedure: CYSTOSCOPY/RETROGRADE/URETEROSCOPY/STONE EXTRACTION WITH BASKET;   Surgeon: Franchot Gallo, MD;  Location: [facility] ORS;  Service: Urology;  Laterality: Right;  . EXTRACORPOREAL SHOCK WAVE LITHOTRIPSY Right 02/25/2017   Procedure: RIGHT EXTRACORPOREAL SHOCK WAVE LITHOTRIPSY (ESWL);  Surgeon: Cleon Gustin, MD;  Location: [facility] ORS;  Service: Urology;  Laterality: Right;  . HOLMIUM LASER APPLICATION Right 16/05/958   Procedure: HOLMIUM LASER APPLICATION;  Surgeon: Franchot Gallo, MD;  Location: [facility] ORS;  Service: Urology;  Laterality: Right;  . IR URETERAL STENT LEFT NEW ACCESS W/O SEP NEPHROSTOMY CATH  07/09/2017  . [place], 2006  . TONSILLECTOMY      Home Medications:    Allergies: No Known Allergies  Family History  Problem Relation Age of Onset  . Heart disease Father        Died 86  . Healthy Mother     Social History:  reports that he quit smoking about 35 years ago. he has never used smokeless tobacco. He reports that he drinks alcohol. He reports that he does not use drugs.  ROS: A complete review of systems was performed.  All systems are negative except for pertinent findings as noted.  Physical Exam:  Vital signs in last 24 hours: Temp:  [98 F (36.7 C)] 98 F (36.7 C) (03/04 0612) Pulse Rate:  [77] 77 (03/04 0612) Resp:  [16] 16 (03/04 0612) BP: (119)/(66) 119/66 (03/04 0613) SpO2:  [97 %] 97 % (03/04 0612) Weight:  [210 lb 8 oz (95.5 kg)] 210 lb 8 oz (95.5 kg) (03/04 0612) Constitutional:  Alert and  oriented, No acute distress Cardiovascular: Regular rate and rhythm, No JVD Respiratory: Normal respiratory effort, Lungs clear bilaterally GI: Abdomen is soft, nontender, nondistended, no abdominal masses. Left perc tube present. Lymphatic: No lymphadenopathy Neurologic: Grossly intact, no focal deficits Psychiatric: Normal mood and affect  Laboratory Data:  Recent Labs    07/09/17 0936  WBC 7.7  HGB 13.4  HCT 40.3  PLT 173    No results for input(s): NA, K, CL, GLUCOSE, BUN, CALCIUM, CREATININE in the  last 72 hours.  Invalid input(s): CO3   No results found for this or any previous visit (from the past 24 hour(s)). No results found for this or any previous visit (from the past 240 hour(s)).  Renal Function: Recent Labs    07/06/17 1347  CREATININE 1.07   Estimated Creatinine Clearance: 70.4 mL/min (by C-G formula based on SCr of 1.07 mg/dL).  Radiologic Imaging: No results found.  Impression/Assessment:  19 mm left renal stone--percutaneous access was gained last week  Plan:  Left PCNL.

## 2017-07-12 NOTE — Plan of Care (Signed)
  Progressing Education: Knowledge of General Education information will improve 07/12/2017 1142 - Progressing by Alyse Kathan, Royetta Crochet, RN Health Behavior/Discharge Planning: Ability to manage health-related needs will improve 07/12/2017 1142 - Progressing by Edilia Ghuman, Royetta Crochet, RN Clinical Measurements: Ability to maintain clinical measurements within normal limits will improve 07/12/2017 1142 - Progressing by Lavone Weisel, Royetta Crochet, RN Will remain free from infection 07/12/2017 1142 - Progressing by Brittanni Cariker, Royetta Crochet, RN Diagnostic test results will improve 07/12/2017 1142 - Progressing by Bettylee Feig, Royetta Crochet, RN Respiratory complications will improve 07/12/2017 1142 - Progressing by Doni Bacha, Royetta Crochet, RN Cardiovascular complication will be avoided 07/12/2017 1142 - Progressing by Aleck Locklin, Royetta Crochet, RN Activity: Risk for activity intolerance will decrease 07/12/2017 1142 - Progressing by Nikkia Devoss, Royetta Crochet, RN Nutrition: Adequate nutrition will be maintained 07/12/2017 1142 - Progressing by Thao Vanover, Royetta Crochet, RN Coping: Level of anxiety will decrease 07/12/2017 1142 - Progressing by Trevone Prestwood, Royetta Crochet, RN Elimination: Will not experience complications related to bowel motility 07/12/2017 1142 - Progressing by Desirre Eickhoff, Royetta Crochet, RN Will not experience complications related to urinary retention 07/12/2017 1142 - Progressing by Freman Lapage, Royetta Crochet, RN Pain Managment: General experience of comfort will improve 07/12/2017 1142 - Progressing by Roc Streett, Royetta Crochet, RN Safety: Ability to remain free from injury will improve 07/12/2017 1142 - Progressing by Erendida Wrenn, Royetta Crochet, RN

## 2017-07-12 NOTE — Anesthesia Procedure Notes (Signed)
Procedure Name: Intubation Date/Time: 07/12/2017 7:35 AM Performed by: Lind Covert, CRNA Pre-anesthesia Checklist: Patient identified, Emergency Drugs available, Suction available, Patient being monitored and Timeout performed Patient Re-evaluated:Patient Re-evaluated prior to induction Oxygen Delivery Method: Circle system utilized Preoxygenation: Pre-oxygenation with 100% oxygen Induction Type: IV induction Ventilation: Mask ventilation without difficulty Laryngoscope Size: Mac and 4 Grade View: Grade I Tube type: Oral Tube size: 7.5 mm Number of attempts: 1 Airway Equipment and Method: Stylet Placement Confirmation: ETT inserted through vocal cords under direct vision,  positive ETCO2 and breath sounds checked- equal and bilateral Secured at: 22 cm Tube secured with: Tape Dental Injury: Teeth and Oropharynx as per pre-operative assessment

## 2017-07-12 NOTE — Transfer of Care (Signed)
Immediate Anesthesia Transfer of Care Note  Patient: Mark Burgess  Procedure(s) Performed: NEPHROLITHOTOMY PERCUTANEOUS (Left )  Patient Location: PACU  Anesthesia Type:General  Level of Consciousness: sedated  Airway & Oxygen Therapy: Patient Spontanous Breathing and Patient connected to face mask oxygen  Post-op Assessment: Report given to RN and Post -op Vital signs reviewed and stable  Post vital signs: Reviewed and stable  Last Vitals:  Vitals:   07/12/17 0612 07/12/17 0613  BP:  119/66  Pulse: 77   Resp: 16   Temp: 36.7 C   SpO2: 97%     Last Pain:  Vitals:   07/12/17 0635  TempSrc:   PainSc: 3       Patients Stated Pain Goal: 2 (04/26/23 4695)  Complications: No apparent anesthesia complications

## 2017-07-12 NOTE — Anesthesia Postprocedure Evaluation (Signed)
Anesthesia Post Note  Patient: Mark Burgess  Procedure(s) Performed: NEPHROLITHOTOMY PERCUTANEOUS (Left )     Patient location during evaluation: PACU Anesthesia Type: General Level of consciousness: awake and alert Pain management: pain level controlled Vital Signs Assessment: post-procedure vital signs reviewed and stable Respiratory status: spontaneous breathing, nonlabored ventilation, respiratory function stable and patient connected to nasal cannula oxygen Cardiovascular status: blood pressure returned to baseline and stable Postop Assessment: no apparent nausea or vomiting Anesthetic complications: no    Last Vitals:  Vitals:   07/12/17 1012 07/12/17 1340  BP: 137/69 126/69  Pulse: 70 72  Resp: 16 16  Temp: 36.8 C 37.1 C  SpO2: 98% 98%    Last Pain:  Vitals:   07/12/17 1340  TempSrc: Oral  PainSc:                  Envy Meno S

## 2017-07-12 NOTE — Op Note (Signed)
Preoperative diagnosis: 19 mm left lower pole stone  Postoperative diagnosis: Same  Principal procedure: Percutaneous nephrolithotomy, left lower pole stone, placement of double-J stent (28 cm by 6 [place] contour, without tether  Surgeon: Kaio Kuhlman  Anesthesia: General endotracheal  Drains: Above mentioned stent, 16 French Foley catheter  Specimen: Stone  Estimated blood loss: Less than 50 mL  Complications: None  Indications: 77 year old male with 19 mm left lower pole stone.  He presents at this time for percutaneous nephrolithotomy.  Percutaneous access was established 3 days ago by Dr. Kathlene Cote.  The patient has been counseled regarding treatment prior to his presentation today.  Treatment options included surveillance without treatment, ureteroscopy, percutaneous nephrolithotomy and shockwave lithotripsy.  Due to the large size of the stone as well as his lower pole location, percutaneous treatment was recommended.  The risks and complications of this procedure were discussed with the patient.  These include but are not limited to bleeding, need for transfusion, loss of kidney, infection, other anesthetic complications.  The patient understands these and desires to proceed.  Description of procedure: The patient was properly identified and marked in the holding area.  He was taken to the operating room where general anesthetic was administered.  The bladder was drained with a 16 French Foley catheter.  He was then transferred to the prone position.  All pressure points and extremities were padded appropriately.  His left flank was then prepped and draped around his existing nephrostomy tube.  Proper timeout was performed.  He did receive preoperative IV antibiotics.  Access to his bladder by guidewire was established with fluoroscopic guidance.  The Kumpe catheter was then removed.  The incision was then made just lateral to the  guidewire, approximately 15 mm long.  Subcutaneous tissue  was dissected with a hemostat.  A 10 French peel-away sheath was then passed over the working guidewire.  The obturator was removed and a second guidewire passed into the bladder using fluoroscopy.  Over top of the working guidewire, I then passed a NephroMax balloon.  The balloon was inflated to 18 atm once a day been advanced up to the stone which was readily of evident on fluoroscopy.  The 20 French sheath was then passed over top of the inflated balloon, up to the point of the stone.  The balloon was then deflated and removed over top of the working guidewire.  The nephroscope was then passed.  Small clots were picked out of the renal pelvis.  The stone was quite evident.  The Swiss lithoclast was then used to fragment the stone into multiple smaller fragments.  The stone was quite dense and the pneumatic energy needed to be used to fragment the stone.  Stone fragments were then removed/picked out.  Several fragments were down at the UPJ, these were carefully removed as well.  The adjacent lower pole calyx was inspected and no stones were seen there.  Repeat inspection of the pelvis revealed no further stones.  At this point, a 28 cm by 6 [place] contour double-J stent was advanced over top of the guidewire.  Using fluoroscopic guidance, this was negotiated into the bladder.  Once a guidewire was pulled, adequate proximal and distal curls were seen using direct endoscopy and fluoroscopic check, respectively.  There being no further stones, and this being a minimally traumatic procedure, I felt that we could perform a "tubeless" procedure.  I then measured the distance between the edge of the calyx and the skin, this was 8-9 cm in  length.  I then used FloSeal to fill the nephrostomy tract once the sheath was removed.  The safety guidewire was then removed as well.  Skin edges were reapproximated using a 2-0 silk placed in a horizontal mattress fashion.  Dry dressings were placed.  The patient was then transferred  to the supine position, awakened and extubated.  He was then taken to the PACU in stable condition, having tolerated the procedure well.

## 2017-07-12 NOTE — Discharge Instructions (Signed)

## 2017-07-13 DIAGNOSIS — N200 Calculus of kidney: Secondary | ICD-10-CM | POA: Diagnosis not present

## 2017-07-13 LAB — HEMOGLOBIN AND HEMATOCRIT, BLOOD
HCT: 34.4 % — ABNORMAL LOW (ref 39.0–52.0)
Hemoglobin: 11.3 g/dL — ABNORMAL LOW (ref 13.0–17.0)

## 2017-07-13 NOTE — Progress Notes (Signed)
Did not take am meds will take at home, pt request. SRP, RN

## 2017-07-13 NOTE — Progress Notes (Signed)
Pt d/c to home; instruction reviewed with pt and wife acknowledged understanding. SRP, RN

## 2017-07-13 NOTE — Discharge Summary (Signed)
Physician Discharge Summary  Patient ID: Mark Burgess MRN: [id number] DOB/AGE: [birth year] 77 y.o.  Admit date: 07/12/2017 Discharge date: 07/13/2017  Admission Diagnoses:  Calculus of kidney 81mm LLP stone.  Discharge Diagnoses:  Principal Problem:   Calculus of kidney   Past Medical History:  Diagnosis Date  . Aortic stenosis    apparent bicuspid aortic valve  . CAD (coronary artery disease)    subtotal occlusion of mid left circumflex, treated successfully w percutaneous coronary intervention using a 3.0- x 16-mm drug eluting stent. Percent stenois was taken from 95-0 w TIMI 2 flow taken to TIMI 3 flow post percutaneous coronary intervention  . Diabetes mellitus    type II   . 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 40%  . Emphysema lung ([place])   . GERD (gastroesophageal reflux disease)   . History of kidney stones   . HTN (hypertension)    x8 years  . Myocardial infarction [facility])    2012    Surgeries: Procedure(s): NEPHROLITHOTOMY PERCUTANEOUS on 07/12/2017 for 29mm LLP stone.    Consultants (if any):   Discharged Condition: Improved  Hospital Course: Mark Burgess is an 77 y.o. male who was admitted 07/12/2017 with a diagnosis of Calculus of kidney and went to the operating room on 07/12/2017 and underwent the above named procedures.  He was doing well on 3/5 with no wound drainage or pain.  The foley had been removed and he was voiding and felt to be ready for discharge.    He was given perioperative antibiotics:  Anti-infectives (From admission, onward)   Start     Dose/Rate Route Frequency Ordered Stop   07/12/17 2200  cephALEXin (KEFLEX) capsule 500 mg     500 mg Oral Every 12 hours 07/12/17 1017     07/12/17 0618  ceFAZolin (ANCEF) IVPB 2g/100 mL premix     2 g 200 mL/hr over 30 Minutes Intravenous 30 min pre-op 07/12/17 0618 07/12/17 0737   07/09/17 1102  ceFAZolin (ANCEF) 2-4 GM/100ML-% IVPB    Comments:  Hilma Favors   : cabinet  override      07/09/17 1102 07/09/17 2314    .  He was given sequential compression devices for DVT prophylaxis.  He benefited maximally from the hospital stay and there were no complications.    Recent vital signs:  Vitals:   07/13/17 0141 07/13/17 0415  BP: (!) 110/52 116/62  Pulse: 77 70  Resp: 18 18  Temp: 98.6 F (37 C) 99.3 F (37.4 C)  SpO2: 94% 95%    Recent laboratory studies:  Lab Results  Component Value Date   HGB 11.3 (L) 07/13/2017   HGB 13.4 07/09/2017   HGB 12.9 (L) 07/06/2017   Lab Results  Component Value Date   WBC 7.7 07/09/2017   PLT 173 07/09/2017   Lab Results  Component Value Date   INR 0.95 07/09/2017   Lab Results  Component Value Date   NA 140 07/06/2017   K 3.9 07/06/2017   CL 107 07/06/2017   CO2 22 07/06/2017   BUN 30 (H) 07/06/2017   CREATININE 1.07 07/06/2017   GLUCOSE 212 (H) 07/06/2017    Discharge Medications:   Allergies as of 07/13/2017   No Known Allergies     Medication List    STOP taking these medications   aspirin EC 81 MG tablet   cephALEXin 500 MG capsule Commonly known as:  KEFLEX     TAKE  these medications   ADVAIR HFA 115-21 MCG/ACT inhaler Generic drug:  fluticasone-salmeterol Inhale 2 puffs into the lungs every 12 (twelve) hours. (0900 & 2100)   allopurinol 300 MG tablet Commonly known as:  ZYLOPRIM Take 300 mg by mouth daily. (0900)   amLODipine 5 MG tablet Commonly known as:  NORVASC Take 5 mg by mouth daily. (0900)   atorvastatin 80 MG tablet Commonly known as:  LIPITOR Take 80 mg by mouth daily. (0900)   carvedilol 12.5 MG tablet Commonly known as:  COREG Take 1 tablet (12.5 mg total) by mouth 2 (two) times daily. What changed:  additional instructions   CENTRUM MEN PO Take 1 tablet by mouth daily. (0900)   finasteride 5 MG tablet Commonly known as:  PROSCAR Take 5 mg by mouth daily. (0900)   latanoprost 0.005 % ophthalmic solution Commonly known as:  XALATAN Place 1 drop  into both eyes at bedtime.   lisinopril-hydrochlorothiazide 20-12.5 MG tablet Commonly known as:  PRINZIDE,ZESTORETIC Take 1 tablet by mouth daily. (0900)   metFORMIN 500 MG 24 hr tablet Commonly known as:  GLUCOPHAGE-XR Take 500 mg by mouth 2 (two) times daily. 0900 & 2100   nitroGLYCERIN 0.4 MG SL tablet Commonly known as:  NITROSTAT PLACE 1 TABLET (0.4 MG TOTAL) UNDER THE TONGUE EVERY 5 (FIVE) MINUTES AS NEEDED.   omeprazole 40 MG capsule Commonly known as:  PRILOSEC Take 40 mg by mouth daily. (0900)   oxybutynin 5 MG tablet Commonly known as:  DITROPAN Take 1 tablet (5 mg total) by mouth every 8 (eight) hours as needed for bladder spasms. You have this medication at home.   oxyCODONE 5 MG immediate release tablet Commonly known as:  ROXICODONE Take 1 tablet (5 mg total) by mouth every 4 (four) hours as needed for severe pain.   spironolactone 25 MG tablet Commonly known as:  ALDACTONE Take 25 mg by mouth daily. (0900)   traMADol 50 MG tablet Commonly known as:  ULTRAM Take 1 tablet (50 mg total) by mouth every 6 (six) hours as needed.       Diagnostic Studies: Dg C-arm 1-60 Min-no Report  Result Date: 07/12/2017 Fluoroscopy was utilized by the requesting physician.  No radiographic interpretation.   Ir Ureteral Stent Left New Access W/o Sep Nephrostomy Cath  Result Date: 07/09/2017 CLINICAL DATA:  Left lower pole renal calculus requiring percutaneous nephrolithotomy which is scheduled for 07/12/2017. Lower pole access and ureteral catheter placement is performed prior to the surgical procedure. EXAM: LEFT URETERAL CATHETER PLACEMENT WITHOUT SEPARATE NEPHROSTOMY TUBE PLACEMENT COMPARISON:  None. ANESTHESIA/SEDATION: Sedation:  2.0 mg IV Versed; 100 mcg IV Fentanyl. Total Moderate Sedation Time:  21 minutes. The patient's level of consciousness and physiologic status were continuously monitored during the procedure by Radiology nursing. Additional Medications: 2 g IV Ancef.  IV antibiotic was administered in an appropriate time frame prior to the procedure. CONTRAST:  75 mL Isovue 370 IV and 25 mL Isovue-300 injected into the left renal collecting system FLUOROSCOPY TIME:  6 minutes and 24 seconds.  220 mGy. PROCEDURE: The procedure, risks, benefits, and alternatives were explained to the patient. Questions regarding the procedure were encouraged and answered. The patient understands and consents to the procedure. A time-out was performed prior to initiating the procedure. Fluoroscopy was initially performed. IV contrast was administered in order to opacified the collecting system. Under fluoroscopy, a lower pole calyx was punctured with a 21 gauge needle. Aspiration of urine was performed followed by injection of contrast  material. A guidewire was then advanced into the collecting system. A transitional dilator was placed. A 5 French catheter was advanced into the collecting system and further advanced into the ureter over a guidewire. The catheter was then advanced into the bladder. The catheter was capped and secured at the skin exit site with a silk retention suture. The catheter was coiled at the skin and an overlying Tegaderm dressing placed. COMPLICATIONS: None. FINDINGS: There is a large calculus in the lower pole of the left kidney. Ultrasound demonstrates no hydronephrosis. IV contrast was administered in order to opacify the collecting system. After access of a lower pole calyx, a guidewire was able to be advanced past the lower pole calculus and into the central collecting system. This allowed eventual placement of a 5 French catheter through the collecting system, down the ureter and just into the bladder. This access was secured and will be utilized for percutaneous nephrolithotomy. IMPRESSION: Placement of left ureteral catheter after percutaneous lower pole access to provide access to a lower pole calculus for percutaneous nephrolithotomy. Electronically Signed   By:  Aletta Edouard M.D.   On: 07/09/2017 15:18    Disposition: 01-Home or Self Care  Discharge Instructions    Discontinue IV   Complete by:  As directed       Follow-up Information    Karen Kays, NP.   Specialty:  Nurse Practitioner Why:  3/18 @ 0930 Contact information: [address] [place] [address] [phone]            Signed: Irine Seal 07/13/2017, 7:19 AM

## 2017-09-22 ENCOUNTER — Other Ambulatory Visit (HOSPITAL_COMMUNITY): Payer: Self-pay | Admitting: Specialist

## 2017-09-22 DIAGNOSIS — R911 Solitary pulmonary nodule: Secondary | ICD-10-CM

## 2017-09-22 DIAGNOSIS — R05 Cough: Secondary | ICD-10-CM

## 2017-09-22 DIAGNOSIS — R053 Chronic cough: Secondary | ICD-10-CM

## 2017-09-29 ENCOUNTER — Ambulatory Visit (HOSPITAL_COMMUNITY)
Admission: RE | Admit: 2017-09-29 | Discharge: 2017-09-29 | Disposition: A | Discharge status: Home / Self Care | Payer: Medicare HMO | Source: Ambulatory Visit | Attending: Specialist | Admitting: Specialist

## 2017-09-29 ENCOUNTER — Encounter (HOSPITAL_COMMUNITY): Payer: Self-pay

## 2017-09-29 DIAGNOSIS — I700 Atherosclerosis of aorta: Secondary | ICD-10-CM | POA: Diagnosis not present

## 2017-09-29 DIAGNOSIS — R918 Other nonspecific abnormal finding of lung field: Secondary | ICD-10-CM | POA: Diagnosis not present

## 2017-09-29 DIAGNOSIS — Z87891 Personal history of nicotine dependence: Secondary | ICD-10-CM | POA: Diagnosis not present

## 2017-09-29 DIAGNOSIS — J984 Other disorders of lung: Secondary | ICD-10-CM | POA: Diagnosis not present

## 2017-09-29 DIAGNOSIS — R053 Chronic cough: Secondary | ICD-10-CM

## 2017-09-29 DIAGNOSIS — J432 Centrilobular emphysema: Secondary | ICD-10-CM | POA: Diagnosis not present

## 2017-09-29 DIAGNOSIS — R911 Solitary pulmonary nodule: Secondary | ICD-10-CM | POA: Insufficient documentation

## 2017-09-29 DIAGNOSIS — R05 Cough: Secondary | ICD-10-CM | POA: Insufficient documentation

## 2017-09-29 DIAGNOSIS — I2510 Atherosclerotic heart disease of native coronary artery without angina pectoris: Secondary | ICD-10-CM | POA: Insufficient documentation

## 2017-09-29 LAB — POCT I-STAT CREATININE: Creatinine, Ser: 0.9 mg/dL (ref 0.61–1.24)

## 2017-09-29 MED ORDER — IOHEXOL 300 MG/ML  SOLN
75.0000 mL | Freq: Once | INTRAMUSCULAR | Status: AC | PRN
  Administered 2017-09-29: 75 mL via INTRAVENOUS

## 2017-10-19 ENCOUNTER — Other Ambulatory Visit: Payer: Self-pay | Admitting: Specialist

## 2017-10-19 DIAGNOSIS — R05 Cough: Secondary | ICD-10-CM

## 2017-10-19 DIAGNOSIS — R059 Cough, unspecified: Secondary | ICD-10-CM

## 2017-10-19 DIAGNOSIS — R911 Solitary pulmonary nodule: Secondary | ICD-10-CM

## 2017-10-19 NOTE — Progress Notes (Signed)
Cardiology Office Note   Date:  10/20/2017   ID:  Mark, Burgess 02-15-1941, MRN [id number]  PCP:  Kirk Ruths, MD  Cardiologist:   Minus Breeding, MD    No chief complaint on file.     History of Present Illness: Mark Burgess is a 77 y.o. male who presents for follow-up of mild left ventricular dysfunction and coronary artery disease as described below.  Since I last saw him he has done well.  He has had problems with kidney stones.  However, he has had no cardiac complaints.  He does some yard work every week but does less work in the winter. The patient denies any new symptoms such as chest discomfort, neck or arm discomfort. There has been no new shortness of breath, PND or orthopnea. There have been no reported palpitations, presyncope or syncope.     Past Medical History:  Diagnosis Date  . Aortic stenosis    apparent bicuspid aortic valve  . CAD (coronary artery disease)    subtotal occlusion of mid left circumflex, treated successfully w percutaneous coronary intervention using a 3.0- x 16-mm drug eluting stent. Percent stenois was taken from 95-0 w TIMI 2 flow taken to TIMI 3 flow post percutaneous coronary intervention  . Diabetes mellitus    type II   . 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 40%  . Emphysema lung ([place])   . GERD (gastroesophageal reflux disease)   . History of kidney stones   . HTN (hypertension)    x8 years  . Myocardial infarction [facility])    2012    Past Surgical History:  Procedure Laterality Date  . APPENDECTOMY    . CARDIAC CATHETERIZATION    . COLONOSCOPY WITH PROPOFOL N/A 03/27/2015   Procedure: COLONOSCOPY WITH PROPOFOL;  Surgeon: Manya Silvas, MD;  Location: [facility] END[facility]COPY;  Service: Endoscopy;  Laterality: N/A;  . CYSTOSCOPY WITH STENT PLACEMENT Right 03/11/2017   Procedure: CYSTOSCOPY WITH STENT PLACEMENT;  Surgeon: Franchot Gallo, MD;  Location: [facility] ORS;  Service: Urology;   Laterality: Right;  . CYSTOSCOPY WITH URETEROSCOPY AND STENT PLACEMENT Right 04/19/2017   Procedure: CYSTOSCOPY WITH URETEROSCOPY AND STENT Removal;  Surgeon: Franchot Gallo, MD;  Location: [facility] ORS;  Service: Urology;  Laterality: Right;  . CYSTOSCOPY/RETROGRADE/URETEROSCOPY/STONE EXTRACTION WITH BASKET Right 03/11/2017   Procedure: CYSTOSCOPY/RETROGRADE/URETEROSCOPY/STONE EXTRACTION WITH BASKET;  Surgeon: Franchot Gallo, MD;  Location: [facility] ORS;  Service: Urology;  Laterality: Right;  . EXTRACORPOREAL SHOCK WAVE LITHOTRIPSY Right 02/25/2017   Procedure: RIGHT EXTRACORPOREAL SHOCK WAVE LITHOTRIPSY (ESWL);  Surgeon: Cleon Gustin, MD;  Location: [facility] ORS;  Service: Urology;  Laterality: Right;  . HOLMIUM LASER APPLICATION Right 40/01/8118   Procedure: HOLMIUM LASER APPLICATION;  Surgeon: Franchot Gallo, MD;  Location: [facility] ORS;  Service: Urology;  Laterality: Right;  . IR URETERAL STENT LEFT NEW ACCESS W/O SEP NEPHROSTOMY CATH  07/09/2017  . [place], 2006  . NEPHROLITHOTOMY Left 07/12/2017   Procedure: NEPHROLITHOTOMY PERCUTANEOUS;  Surgeon: Franchot Gallo, MD;  Location: [facility] OR[facility];  Service: Urology;  Laterality: Left;  . TONSILLECTOMY       Current Outpatient Medications  Medication Sig Dispense Refill  . ADVAIR HFA 115-21 MCG/ACT inhaler Inhale 2 puffs into the lungs every 12 (twelve) hours. (0900 & 2100)    . allopurinol (ZYLOPRIM) 300 MG tablet Take 300 mg by mouth daily. (0900)    . amLODipine (NORVASC) 5 MG tablet Take 5 mg by  mouth daily. (0900)    . atorvastatin (LIPITOR) 80 MG tablet Take 80 mg by mouth daily. (0900)    . carvedilol (COREG) 12.5 MG tablet Take 1 tablet (12.5 mg total) by mouth 2 (two) times daily. (Patient taking differently: Take 12.5 mg by mouth 2 (two) times daily. (0900 &2100)) 60 tablet 5  . finasteride (PROSCAR) 5 MG tablet Take 5 mg by mouth daily. (0900)    . latanoprost (XALATAN) 0.005 % ophthalmic solution Place 1 drop into both eyes at  bedtime.     Marland Kitchen lisinopril-hydrochlorothiazide (PRINZIDE,ZESTORETIC) 20-12.5 MG per tablet Take 1 tablet by mouth daily. (0900)    . metFORMIN (GLUCOPHAGE-XR) 500 MG 24 hr tablet Take 500 mg by mouth 2 (two) times daily. 0900 & 2100  3  . Multiple Vitamins-Minerals (CENTRUM MEN PO) Take 1 tablet by mouth daily. (0900)    . nitroGLYCERIN (NITROSTAT) 0.4 MG SL tablet PLACE 1 TABLET (0.4 MG TOTAL) UNDER THE TONGUE EVERY 5 (FIVE) MINUTES AS NEEDED. 25 tablet 2  . omeprazole (PRILOSEC) 40 MG capsule Take 40 mg by mouth daily. (0900)    . oxybutynin (DITROPAN) 5 MG tablet Take 1 tablet (5 mg total) by mouth every 8 (eight) hours as needed for bladder spasms. You have this medication at home. 30 tablet 1  . oxyCODONE (ROXICODONE) 5 MG immediate release tablet Take 1 tablet (5 mg total) by mouth every 4 (four) hours as needed for severe pain. 15 tablet 0  . spironolactone (ALDACTONE) 25 MG tablet Take 25 mg by mouth daily. (0900)    . traMADol (ULTRAM) 50 MG tablet Take 1 tablet (50 mg total) by mouth every 6 (six) hours as needed. 10 tablet 0   No current facility-administered medications for this visit.     Allergies:   Patient has no known allergies.    ROS:  Please see the history of present illness.   Otherwise, review of systems are positive for none.   All other systems are reviewed and negative.    PHYSICAL EXAM: VS:  BP 120/72   Pulse 74   Ht 6\' 2"  (1.88 m)   Wt 211 lb (95.7 kg)   SpO2 96%   BMI 27.09 kg/m  , BMI Body mass index is 27.09 kg/m.  GENERAL:  Well appearing NECK:  No jugular venous distention, waveform within normal limits, carotid upstroke brisk and symmetric, no bruits, no thyromegaly LUNGS:  Clear to auscultation bilaterally CHEST:  Unremarkable HEART:  PMI not displaced or sustained,S1 and S2 within normal limits, no S3, no S4, no clicks, no rubs, no murmurs ABD:  Flat, positive bowel sounds normal in frequency in pitch, no bruits, no rebound, no guarding, no midline  pulsatile mass, no hepatomegaly, no splenomegaly EXT:  2 plus pulses throughout, no edema, no cyanosis no clubbing    EKG:  EKG is not ordered today. The ekg ordered 07/06/17 demonstrates sinus rhythm, rate 77, left axis deviation, borderline interventricular conduction delay, no acute ST-T wave changes.  No change from previous.    Recent Labs: 07/06/2017: BUN 30; Potassium 3.9; Sodium 140 07/09/2017: Platelets 173 07/13/2017: Hemoglobin 11.3 09/29/2017: Creatinine, Ser 0.90    Lipid Panel   Wt Readings from Last 3 Encounters:  10/20/17 211 lb (95.7 kg)  07/12/17 210 lb 8 oz (95.5 kg)  07/06/17 210 lb 8 oz (95.5 kg)      Other studies Reviewed: Additional studies/ records that were reviewed today include: Labs. Review of the above records demonstrates:  ASSESSMENT AND PLAN:  CAD:    I will bring the patient back for a POET (Plain Old Exercise Test). This will allow me to screen for obstructive coronary disease, risk stratify and very importantly provide a prescription for exercise.  CM:   The EF was mildly reduced but unchanged from previous on echo last year.  No further testing is indicated.   OVERWEIGHT:   We talked at length about this again today.  I gave him specific suggestions for diet and exercise.   DM:  His A1C was 7.0. This is followed by Kirk Ruths, MD .  I will suggest Jardiance for the secondary cardiac benefits.    HTN:   The blood pressure is at target. No change in medications is indicated. We will continue with therapeutic lifestyle changes (TLC).  DYSLIPIDEMIA:  LDL was 91 last month and is not at target.  I will change to Crestor 40 mg and stop Lipitor.    Current medicines are reviewed at length with the patient today.  The patient does not have concerns regarding medicines.  The following changes have been made:  As above.   Labs/ tests ordered today include:    No orders of the defined types were placed in this  encounter.    Disposition:   FU with one year.     Signed, Minus Breeding, MD  10/20/2017 10:20 AM    [facility]

## 2017-10-20 ENCOUNTER — Encounter: Payer: Self-pay | Admitting: Cardiology

## 2017-10-20 ENCOUNTER — Ambulatory Visit: Payer: Medicare HMO | Admitting: Cardiology

## 2017-10-20 VITALS — BP 120/72 | HR 74 | Ht 74.0 in | Wt 211.0 lb

## 2017-10-20 DIAGNOSIS — I2510 Atherosclerotic heart disease of native coronary artery without angina pectoris: Secondary | ICD-10-CM

## 2017-10-20 DIAGNOSIS — E785 Hyperlipidemia, unspecified: Secondary | ICD-10-CM

## 2017-10-20 DIAGNOSIS — Z79899 Other long term (current) drug therapy: Secondary | ICD-10-CM | POA: Diagnosis not present

## 2017-10-20 MED ORDER — ROSUVASTATIN CALCIUM 40 MG PO TABS: 40.0000 mg | ORAL_TABLET | Freq: Every day | ORAL | 3 refills | Status: AC

## 2017-10-20 NOTE — Patient Instructions (Signed)
Medication Instructions:  STOP- Lipitor(Atorvastatin) START- Crestor 40 mg daily  If you need a refill on your cardiac medications before your next appointment, please call your pharmacy.  Labwork: Fasting Lipid Liver in 10 weeks HERE IN OUR OFFICE AT [facility]  Take the provided lab slips with you to the lab for your blood draw.   You will need to fast. DO NOT EAT OR DRINK PAST MIDNIGHT.   Testing/Procedures: Your physician has requested that you have an exercise tolerance test. For further information please visit [email or web address]. Please also follow instruction sheet, as given.   Follow-Up: Your physician wants you to follow-up in: 1 Year. You should receive a reminder letter in the mail two months in advance. If you do not receive a letter, please call our office [phone].      Thank you for choosing [facility] at [facility]!!

## 2017-11-10 ENCOUNTER — Telehealth (HOSPITAL_COMMUNITY): Payer: Self-pay

## 2017-11-10 NOTE — Telephone Encounter (Signed)
Encounter complete. 

## 2017-11-16 ENCOUNTER — Ambulatory Visit (HOSPITAL_COMMUNITY)
Admission: RE | Admit: 2017-11-16 | Discharge: 2017-11-16 | Disposition: A | Discharge status: Home / Self Care | Payer: Medicare HMO | Source: Ambulatory Visit | Attending: Cardiovascular Disease | Admitting: Cardiovascular Disease

## 2017-11-16 ENCOUNTER — Encounter (HOSPITAL_COMMUNITY): Payer: Self-pay | Admitting: *Deleted

## 2017-11-16 DIAGNOSIS — I2510 Atherosclerotic heart disease of native coronary artery without angina pectoris: Secondary | ICD-10-CM | POA: Diagnosis not present

## 2017-11-16 LAB — EXERCISE TOLERANCE TEST
CSEPED: 3 min
CSEPEDS: 57 s
CSEPEW: 5.7 METS
MPHR: 145 {beats}/min
Peak HR: 141 {beats}/min
Percent HR: 97 %
RPE: 18
Rest HR: 93 {beats}/min

## 2017-11-16 NOTE — Progress Notes (Signed)
Abnormal ETT shown to Dr Gwenlyn Found who recommended pt see Dr Percival Spanish next week. Appt was made on 11/25/17.

## 2017-11-24 NOTE — H&P (View-Only) (Signed)
Cardiology Office Note   Date:  11/25/2017   ID:  Gabrien, Mentink [birth year], MRN [id number]  PCP:  Kirk Ruths, MD  Cardiologist:   Minus Breeding, MD    Chief Complaint  Patient presents with  . Shortness of Breath      History of Present Illness: Mark Burgess is a 77 y.o. male who presents for follow-up of mild left ventricular dysfunction and coronary artery disease as described below.    At the last visit I screened him with a POET (Plain Old Exercise Treadmill).  He had ST depression in stage II.  He was quite short of breath even at the beginning of stage II with this.  In retrospect he does get more short of breath with activities and this is more than he described to me previously.  He is not getting any chest pressure, neck or arm discomfort.  He is not having any palpitations, presyncope or syncope.  He does not have any resting shortness of breath, PND or orthopnea.  He is had no weight gain or edema.   Past Medical History:  Diagnosis Date  . Aortic stenosis    apparent bicuspid aortic valve  . CAD (coronary artery disease)    subtotal occlusion of mid left circumflex, treated successfully w percutaneous coronary intervention using a 3.0- x 16-mm drug eluting stent. Percent stenois was taken from 95-0 w TIMI 2 flow taken to TIMI 3 flow post percutaneous coronary intervention  . Diabetes mellitus    type II   . 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 40%  . Emphysema lung ([place])   . GERD (gastroesophageal reflux disease)   . History of kidney stones   . HTN (hypertension)    x8 years  . Myocardial infarction [facility])    2012    Past Surgical History:  Procedure Laterality Date  . APPENDECTOMY    . CARDIAC CATHETERIZATION    . COLONOSCOPY WITH PROPOFOL N/A 03/27/2015   Procedure: COLONOSCOPY WITH PROPOFOL;  Surgeon: Manya Silvas, MD;  Location: [facility] ENDOSCOPY;  Service: Endoscopy;  Laterality: N/A;  .  CYSTOSCOPY WITH STENT PLACEMENT Right 03/11/2017   Procedure: CYSTOSCOPY WITH STENT PLACEMENT;  Surgeon: Franchot Gallo, MD;  Location: [facility] ORS;  Service: Urology;  Laterality: Right;  . CYSTOSCOPY WITH URETEROSCOPY AND STENT PLACEMENT Right 04/19/2017   Procedure: CYSTOSCOPY WITH URETEROSCOPY AND STENT Removal;  Surgeon: Franchot Gallo, MD;  [facility];  Service: Urology;  Laterality: Right;  . CYSTOSCOPY/RETROGRADE/URETEROSCOPY/STONE EXTRACTION WITH BASKET Right 03/11/2017   Procedure: CYSTOSCOPY/RETROGRADE/URETEROSCOPY/STONE EXTRACTION WITH BASKET;  Surgeon: Franchot Gallo, MD;  [facility];  Service: Urology;  Laterality: Right;  . EXTRACORPOREAL SHOCK WAVE LITHOTRIPSY Right 02/25/2017   Procedure: RIGHT EXTRACORPOREAL SHOCK WAVE LITHOTRIPSY (ESWL);  Surgeon: Cleon Gustin, MD;  [facility];  Service: Urology;  Laterality: Right;  . HOLMIUM LASER APPLICATION Right 61/01/5092   Procedure: HOLMIUM LASER APPLICATION;  Surgeon: Franchot Gallo, MD;  [facility];  Service: Urology;  Laterality: Right;  . IR URETERAL STENT LEFT NEW ACCESS W/O SEP NEPHROSTOMY CATH  07/09/2017  . [place], 2006  . NEPHROLITHOTOMY Left 07/12/2017   Procedure: NEPHROLITHOTOMY PERCUTANEOUS;  Surgeon: Franchot Gallo, MD;  [facility];  Service: Urology;  Laterality: Left;  . TONSILLECTOMY       Current Outpatient Medications  Medication Sig Dispense Refill  . ADVAIR HFA 115-21 MCG/ACT inhaler Inhale 2 puffs into the  lungs every 12 (twelve) hours. (0900 & 2100)    . allopurinol (ZYLOPRIM) 300 MG tablet Take 300 mg by mouth daily. (0900)    . amLODipine (NORVASC) 5 MG tablet Take 5 mg by mouth daily. (0900)    . carvedilol (COREG) 12.5 MG tablet Take 1 tablet (12.5 mg total) by mouth 2 (two) times daily. (Patient taking differently: Take 12.5 mg by mouth 2 (two) times daily. (0900 &2100)) 60 tablet 5  . finasteride (PROSCAR) 5 MG tablet Take 5 mg by mouth daily.  (0900)    . latanoprost (XALATAN) 0.005 % ophthalmic solution Place 1 drop into both eyes at bedtime.     Marland Kitchen lisinopril-hydrochlorothiazide (PRINZIDE,ZESTORETIC) 20-12.5 MG per tablet Take 1 tablet by mouth daily. (0900)    . metFORMIN (GLUCOPHAGE-XR) 500 MG 24 hr tablet Take 500 mg by mouth 2 (two) times daily. 0900 & 2100  3  . Multiple Vitamins-Minerals (CENTRUM MEN PO) Take 1 tablet by mouth daily. (0900)    . nitroGLYCERIN (NITROSTAT) 0.4 MG SL tablet PLACE 1 TABLET (0.4 MG TOTAL) UNDER THE TONGUE EVERY 5 (FIVE) MINUTES AS NEEDED. 25 tablet 2  . omeprazole (PRILOSEC) 40 MG capsule Take 40 mg by mouth daily. (0900)    . oxybutynin (DITROPAN) 5 MG tablet Take 1 tablet (5 mg total) by mouth every 8 (eight) hours as needed for bladder spasms. You have this medication at home. 30 tablet 1  . oxyCODONE (ROXICODONE) 5 MG immediate release tablet Take 1 tablet (5 mg total) by mouth every 4 (four) hours as needed for severe pain. 15 tablet 0  . rosuvastatin (CRESTOR) 40 MG tablet Take 1 tablet (40 mg total) by mouth daily. 90 tablet 3  . spironolactone (ALDACTONE) 25 MG tablet Take 25 mg by mouth daily. (0900)    . traMADol (ULTRAM) 50 MG tablet Take 1 tablet (50 mg total) by mouth every 6 (six) hours as needed. 10 tablet 0   No current facility-administered medications for this visit.     Allergies:   Patient has no known allergies.    ROS:  Please see the history of present illness.   Otherwise, review of systems are positive for none.   All other systems are reviewed and negative.    PHYSICAL EXAM: VS:  BP 104/60 (BP Location: Left Arm, Patient Position: Sitting, Cuff Size: Normal)   Pulse 84   Ht 6\' 2"  (1.88 m)   Wt 213 lb (96.6 kg)   BMI 27.35 kg/m  , BMI Body mass index is 27.35 kg/m.  GENERAL:  Well appearing NECK:  No jugular venous distention, waveform within normal limits, carotid upstroke brisk and symmetric, no bruits, no thyromegaly LUNGS:  Clear to auscultation  bilaterally CHEST:  Unremarkable HEART:  PMI not displaced or sustained,S1 and S2 within normal limits, no S3, no S4, no clicks, no rubs, no murmurs ABD:  Flat, positive bowel sounds normal in frequency in pitch, no bruits, no rebound, no guarding, no midline pulsatile mass, no hepatomegaly, no splenomegaly EXT:  2 plus pulses throughout, no edema, no cyanosis no clubbing    EKG:  EKG is not ordered today.    Recent Labs: 07/06/2017: BUN 30; Potassium 3.9; Sodium 140 07/09/2017: Platelets 173 07/13/2017: Hemoglobin 11.3 09/29/2017: Creatinine, Ser 0.90   Lab Results  Component Value Date   HGBA1C 7.1 (H) 07/06/2017    Lipid Panel   Wt Readings from Last 3 Encounters:  11/25/17 213 lb (96.6 kg)  10/20/17 211 lb (95.7 kg)  07/12/17 210 lb 8 oz (95.5 kg)      Other studies Reviewed: Additional studies/ records that were reviewed today include: Lexiscan Myoview.. Review of the above records demonstrates:  See elsewhere  ASSESSMENT AND PLAN:  CAD:   He had an abnormal stress test as above.  I think this is a high risk finding given the fact that ST depression occurred early with exercise and he had significant SOB.   Therefore, cath is indicated.  The patient understands that risks included but are not limited to stroke (1 in 1000), death (1 in 58), kidney failure [usually temporary] (1 in 500), bleeding (1 in 200), allergic reaction [possibly serious] (1 in 200).  The patient understands and agrees to proceed.   CM:   The EF was mildly reduced and stable on echo (45%) last year.    I will continue to manage this medically.   OVERWEIGHT:   We have had long discussions about this.  I will continue to work with him on this.   DM:  His A1C was 7.1.   He will continue to follow with Kirk Ruths, MD   HTN:   The blood pressure is at target.  No change in therapy.   DYSLIPIDEMIA:  LDL was 91 .  I changed to Crestor and I will follow up with labs in the future.    Current  medicines are reviewed at length with the patient today.  The patient does not have concerns regarding medicines.  The following changes have been made:  None  Labs/ tests ordered today include:    Orders Placed This Encounter  Procedures  . CBC  . Basic Metabolic Panel (BMET)  . TSH     Disposition:   FU with me after thethe cath.   Signed, Minus Breeding, MD  11/25/2017 12:41 PM    [facility]

## 2017-11-24 NOTE — Progress Notes (Signed)
Cardiology Office Note   Date:  11/25/2017   ID:  Eoin, Willden 31-May-1940, MRN [id number]  PCP:  Kirk Ruths, MD  Cardiologist:   Minus Breeding, MD    Chief Complaint  Patient presents with  . Shortness of Breath      History of Present Illness: Mark Burgess is a 77 y.o. male who presents for follow-up of mild left ventricular dysfunction and coronary artery disease as described below.    At the last visit I screened him with a POET (Plain Old Exercise Treadmill).  He had ST depression in stage II.  He was quite short of breath even at the beginning of stage II with this.  In retrospect he does get more short of breath with activities and this is more than he described to me previously.  He is not getting any chest pressure, neck or arm discomfort.  He is not having any palpitations, presyncope or syncope.  He does not have any resting shortness of breath, PND or orthopnea.  He is had no weight gain or edema.   Past Medical History:  Diagnosis Date  . Aortic stenosis    apparent bicuspid aortic valve  . CAD (coronary artery disease)    subtotal occlusion of mid left circumflex, treated successfully w percutaneous coronary intervention using a 3.0- x 16-mm drug eluting stent. Percent stenois was taken from 95-0 w TIMI 2 flow taken to TIMI 3 flow post percutaneous coronary intervention  . Diabetes mellitus    type II   . 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 40%  . Emphysema lung ([place])   . GERD (gastroesophageal reflux disease)   . History of kidney stones   . HTN (hypertension)    x8 years  . Myocardial infarction [facility])    2012    Past Surgical History:  Procedure Laterality Date  . APPENDECTOMY    . CARDIAC CATHETERIZATION    . COLONOSCOPY WITH PROPOFOL N/A 03/27/2015   Procedure: COLONOSCOPY WITH PROPOFOL;  Surgeon: Manya Silvas, MD;  Location: [facility] ENDOSCOPY;  Service: Endoscopy;  Laterality: N/A;  .  CYSTOSCOPY WITH STENT PLACEMENT Right 03/11/2017   Procedure: CYSTOSCOPY WITH STENT PLACEMENT;  Surgeon: Franchot Gallo, MD;  [facility];  Service: Urology;  Laterality: Right;  . CYSTOSCOPY WITH URETEROSCOPY AND STENT PLACEMENT Right 04/19/2017   Procedure: CYSTOSCOPY WITH URETEROSCOPY AND STENT Removal;  Surgeon: Franchot Gallo, MD;  [facility];  Service: Urology;  Laterality: Right;  . CYSTOSCOPY/RETROGRADE/URETEROSCOPY/STONE EXTRACTION WITH BASKET Right 03/11/2017   Procedure: CYSTOSCOPY/RETROGRADE/URETEROSCOPY/STONE EXTRACTION WITH BASKET;  Surgeon: Franchot Gallo, MD;  [facility];  Service: Urology;  Laterality: Right;  . EXTRACORPOREAL SHOCK WAVE LITHOTRIPSY Right 02/25/2017   Procedure: RIGHT EXTRACORPOREAL SHOCK WAVE LITHOTRIPSY (ESWL);  Surgeon: Cleon Gustin, MD;  [facility];  Service: Urology;  Laterality: Right;  . HOLMIUM LASER APPLICATION Right 70/05/7791   Procedure: HOLMIUM LASER APPLICATION;  Surgeon: Franchot Gallo, MD;  [facility];  Service: Urology;  Laterality: Right;  . IR URETERAL STENT LEFT NEW ACCESS W/O SEP NEPHROSTOMY CATH  07/09/2017  . [place], 2006  . NEPHROLITHOTOMY Left 07/12/2017   Procedure: NEPHROLITHOTOMY PERCUTANEOUS;  Surgeon: Franchot Gallo, MD;  [facility];  Service: Urology;  Laterality: Left;  . TONSILLECTOMY       Current Outpatient Medications  Medication Sig Dispense Refill  . ADVAIR HFA 115-21 MCG/ACT inhaler Inhale 2 puffs into the  lungs every 12 (twelve) hours. (0900 & 2100)    . allopurinol (ZYLOPRIM) 300 MG tablet Take 300 mg by mouth daily. (0900)    . amLODipine (NORVASC) 5 MG tablet Take 5 mg by mouth daily. (0900)    . carvedilol (COREG) 12.5 MG tablet Take 1 tablet (12.5 mg total) by mouth 2 (two) times daily. (Patient taking differently: Take 12.5 mg by mouth 2 (two) times daily. (0900 &2100)) 60 tablet 5  . finasteride (PROSCAR) 5 MG tablet Take 5 mg by mouth daily.  (0900)    . latanoprost (XALATAN) 0.005 % ophthalmic solution Place 1 drop into both eyes at bedtime.     Marland Kitchen lisinopril-hydrochlorothiazide (PRINZIDE,ZESTORETIC) 20-12.5 MG per tablet Take 1 tablet by mouth daily. (0900)    . metFORMIN (GLUCOPHAGE-XR) 500 MG 24 hr tablet Take 500 mg by mouth 2 (two) times daily. 0900 & 2100  3  . Multiple Vitamins-Minerals (CENTRUM MEN PO) Take 1 tablet by mouth daily. (0900)    . nitroGLYCERIN (NITROSTAT) 0.4 MG SL tablet PLACE 1 TABLET (0.4 MG TOTAL) UNDER THE TONGUE EVERY 5 (FIVE) MINUTES AS NEEDED. 25 tablet 2  . omeprazole (PRILOSEC) 40 MG capsule Take 40 mg by mouth daily. (0900)    . oxybutynin (DITROPAN) 5 MG tablet Take 1 tablet (5 mg total) by mouth every 8 (eight) hours as needed for bladder spasms. You have this medication at home. 30 tablet 1  . oxyCODONE (ROXICODONE) 5 MG immediate release tablet Take 1 tablet (5 mg total) by mouth every 4 (four) hours as needed for severe pain. 15 tablet 0  . rosuvastatin (CRESTOR) 40 MG tablet Take 1 tablet (40 mg total) by mouth daily. 90 tablet 3  . spironolactone (ALDACTONE) 25 MG tablet Take 25 mg by mouth daily. (0900)    . traMADol (ULTRAM) 50 MG tablet Take 1 tablet (50 mg total) by mouth every 6 (six) hours as needed. 10 tablet 0   No current facility-administered medications for this visit.     Allergies:   Patient has no known allergies.    ROS:  Please see the history of present illness.   Otherwise, review of systems are positive for none.   All other systems are reviewed and negative.    PHYSICAL EXAM: VS:  BP 104/60 (BP Location: Left Arm, Patient Position: Sitting, Cuff Size: Normal)   Pulse 84   Ht 6\' 2"  (1.88 m)   Wt 213 lb (96.6 kg)   BMI 27.35 kg/m  , BMI Body mass index is 27.35 kg/m.  GENERAL:  Well appearing NECK:  No jugular venous distention, waveform within normal limits, carotid upstroke brisk and symmetric, no bruits, no thyromegaly LUNGS:  Clear to auscultation  bilaterally CHEST:  Unremarkable HEART:  PMI not displaced or sustained,S1 and S2 within normal limits, no S3, no S4, no clicks, no rubs, no murmurs ABD:  Flat, positive bowel sounds normal in frequency in pitch, no bruits, no rebound, no guarding, no midline pulsatile mass, no hepatomegaly, no splenomegaly EXT:  2 plus pulses throughout, no edema, no cyanosis no clubbing    EKG:  EKG is not ordered today.    Recent Labs: 07/06/2017: BUN 30; Potassium 3.9; Sodium 140 07/09/2017: Platelets 173 07/13/2017: Hemoglobin 11.3 09/29/2017: Creatinine, Ser 0.90   Lab Results  Component Value Date   HGBA1C 7.1 (H) 07/06/2017    Lipid Panel   Wt Readings from Last 3 Encounters:  11/25/17 213 lb (96.6 kg)  10/20/17 211 lb (95.7 kg)  07/12/17 210 lb 8 oz (95.5 kg)      Other studies Reviewed: Additional studies/ records that were reviewed today include: Lexiscan Myoview.. Review of the above records demonstrates:  See elsewhere  ASSESSMENT AND PLAN:  CAD:   He had an abnormal stress test as above.  I think this is a high risk finding given the fact that ST depression occurred early with exercise and he had significant SOB.   Therefore, cath is indicated.  The patient understands that risks included but are not limited to stroke (1 in 1000), death (1 in 59), kidney failure [usually temporary] (1 in 500), bleeding (1 in 200), allergic reaction [possibly serious] (1 in 200).  The patient understands and agrees to proceed.   CM:   The EF was mildly reduced and stable on echo (45%) last year.    I will continue to manage this medically.   OVERWEIGHT:   We have had long discussions about this.  I will continue to work with him on this.   DM:  His A1C was 7.1.   He will continue to follow with Kirk Ruths, MD   HTN:   The blood pressure is at target.  No change in therapy.   DYSLIPIDEMIA:  LDL was 91 .  I changed to Crestor and I will follow up with labs in the future.    Current  medicines are reviewed at length with the patient today.  The patient does not have concerns regarding medicines.  The following changes have been made:  None  Labs/ tests ordered today include:    Orders Placed This Encounter  Procedures  . CBC  . Basic Metabolic Panel (BMET)  . TSH     Disposition:   FU with me after thethe cath.   Signed, Minus Breeding, MD  11/25/2017 12:41 PM    [facility]

## 2017-11-25 ENCOUNTER — Encounter: Payer: Self-pay | Admitting: Cardiology

## 2017-11-25 ENCOUNTER — Ambulatory Visit: Payer: Medicare HMO | Admitting: Cardiology

## 2017-11-25 VITALS — BP 104/60 | HR 84 | Ht 74.0 in | Wt 213.0 lb

## 2017-11-25 DIAGNOSIS — R9439 Abnormal result of other cardiovascular function study: Secondary | ICD-10-CM | POA: Diagnosis not present

## 2017-11-25 DIAGNOSIS — R5383 Other fatigue: Secondary | ICD-10-CM | POA: Diagnosis not present

## 2017-11-25 DIAGNOSIS — R0609 Other forms of dyspnea: Secondary | ICD-10-CM

## 2017-11-25 DIAGNOSIS — Z01812 Encounter for preprocedural laboratory examination: Secondary | ICD-10-CM

## 2017-11-25 NOTE — Patient Instructions (Addendum)
Medication Instructions:  Continue current medications  If you need a refill on your cardiac medications before your next appointment, please call your pharmacy.  Labwork: Pre Op Lab HERE IN OUR OFFICE AT [facility]  Take the provided lab slips with you to the lab for your blood draw.   You will NOT need to fast   Testing/Procedures: Your physician has requested that you have a cardiac catheterization. Cardiac catheterization is used to diagnose and/or treat various heart conditions. Doctors may recommend this procedure for a number of different reasons. The most common reason is to evaluate chest pain. Chest pain can be a symptom of coronary artery disease (CAD), and cardiac catheterization can show whether plaque is narrowing or blocking your heart's arteries. This procedure is also used to evaluate the valves, as well as measure the blood flow and oxygen levels in different parts of your heart. For further information please visit [email or web address]. Please follow instruction sheet, as given.  Special Instructions:   [facility] [address] [place] [address] Dept: [phone] Loc: [place]  11/25/2017  You are scheduled for a Cardiac Catheterization on Tuesday, July 23 with Dr. Larae Grooms.  1. Please arrive at the [facility] (Main Entrance A) at [facility]: [place], [place] [address] at 10:00 AM (two hours before your procedure to ensure your preparation). Free valet parking service is available.   Special note: Every effort is made to have your procedure done on time. Please understand that emergencies sometimes delay scheduled procedures.  2. Diet: Do not eat or drink anything after midnight prior to your procedure except sips of water to take medications.  3. Labs: Today  4. Medication instructions in preparation for your  procedure:   Current Outpatient Medications (Endocrine & Metabolic):  .  metFORMIN (GLUCOPHAGE-XR) 500 MG 24 hr tablet, Take 500 mg by mouth 2 (two) times daily. 0900 & 2100  Current Outpatient Medications (Cardiovascular):  .  amLODipine (NORVASC) 5 MG tablet, Take 5 mg by mouth daily. (0900) .  carvedilol (COREG) 12.5 MG tablet, Take 1 tablet (12.5 mg total) by mouth 2 (two) times daily. (Patient taking differently: Take 12.5 mg by mouth 2 (two) times daily. (0900 &2100)) .  lisinopril-hydrochlorothiazide (PRINZIDE,ZESTORETIC) 20-12.5 MG per tablet, Take 1 tablet by mouth daily. (0900) .  nitroGLYCERIN (NITROSTAT) 0.4 MG SL tablet, PLACE 1 TABLET (0.4 MG TOTAL) UNDER THE TONGUE EVERY 5 (FIVE) MINUTES AS NEEDED. Marland Kitchen  rosuvastatin (CRESTOR) 40 MG tablet, Take 1 tablet (40 mg total) by mouth daily. Marland Kitchen  spironolactone (ALDACTONE) 25 MG tablet, Take 25 mg by mouth daily. (0900)  Current Outpatient Medications (Respiratory):  Marland Kitchen  ADVAIR HFA 115-21 MCG/ACT inhaler, Inhale 2 puffs into the lungs every 12 (twelve) hours. (0900 & 2100)  Current Outpatient Medications (Analgesics):  .  allopurinol (ZYLOPRIM) 300 MG tablet, Take 300 mg by mouth daily. (0900) .  oxyCODONE (ROXICODONE) 5 MG immediate release tablet, Take 1 tablet (5 mg total) by mouth every 4 (four) hours as needed for severe pain. .  traMADol (ULTRAM) 50 MG tablet, Take 1 tablet (50 mg total) by mouth every 6 (six) hours as needed.   Current Outpatient Medications (Other):  .  finasteride (PROSCAR) 5 MG tablet, Take 5 mg by mouth daily. (0900) .  latanoprost (XALATAN) 0.005 % ophthalmic solution, Place 1 drop into both eyes at bedtime.  .  Multiple Vitamins-Minerals (CENTRUM MEN  PO), Take 1 tablet by mouth daily. (0900) .  omeprazole (PRILOSEC) 40 MG capsule, Take 40 mg by mouth daily. (0900) .  oxybutynin (DITROPAN) 5 MG tablet, Take 1 tablet (5 mg total) by mouth every 8 (eight) hours as needed for bladder spasms. You have this  medication at home. *For reference purposes while preparing patient instructions.   Delete this med list prior to printing instructions for patient.*  Hold Metformin one day before cath and two days after cath  Stop taking, Lisinopril (Zestril or Prinivil) on Monday, July 22.     On the morning of your procedure, take your  morning medicines NOT listed above.  You may use sips of water.  5. Plan for one night stay--bring personal belongings. 6. Bring a current list of your medications and current insurance cards. 7. You MUST have a responsible person to drive you home. 8. Someone MUST be with you the first 24 hours after you arrive home or your discharge will be delayed. 9. Please wear clothes that are easy to get on and off and wear slip-on shoes.  Thank you for allowing [place] to care for you!   -- [facility]   Follow-Up: Your physician wants you to follow-up in: After Cath.      Thank you for choosing [facility] at [facility]!!

## 2017-11-26 LAB — BASIC METABOLIC PANEL
BUN / CREAT RATIO: 22 (ref 10–24)
BUN: 30 mg/dL — ABNORMAL HIGH (ref 8–27)
CO2: 23 mmol/L (ref 20–29)
Calcium: 10.6 mg/dL — ABNORMAL HIGH (ref 8.6–10.2)
Chloride: 100 mmol/L (ref 96–106)
Creatinine, Ser: 1.36 mg/dL — ABNORMAL HIGH (ref 0.76–1.27)
GFR, EST AFRICAN AMERICAN: 58 mL/min/{1.73_m2} — AB (ref >59)
GFR, EST NON AFRICAN AMERICAN: 51 mL/min/{1.73_m2} — AB (ref >59)
Glucose: 272 mg/dL — ABNORMAL HIGH (ref 65–99)
Potassium: 4.6 mmol/L (ref 3.5–5.2)
SODIUM: 140 mmol/L (ref 134–144)

## 2017-11-26 LAB — CBC
HEMATOCRIT: 38.6 % (ref 37.5–51.0)
Hemoglobin: 12.8 g/dL — ABNORMAL LOW (ref 13.0–17.7)
MCH: 29.4 pg (ref 26.6–33.0)
MCHC: 33.2 g/dL (ref 31.5–35.7)
MCV: 89 fL (ref 79–97)
Platelets: 218 10*3/uL (ref 150–450)
RBC: 4.35 x10E6/uL (ref 4.14–5.80)
RDW: 14 % (ref 12.3–15.4)
WBC: 9.1 10*3/uL (ref 3.4–10.8)

## 2017-11-26 LAB — TSH: TSH: 1.35 u[IU]/mL (ref 0.450–4.500)

## 2017-11-29 ENCOUNTER — Telehealth: Payer: Self-pay | Admitting: *Deleted

## 2017-11-29 NOTE — Telephone Encounter (Addendum)
Catheterization scheduled at [facility] for: Tuesday July 23,2019 12 noon Verify arrival time and place: [place] Entrance A at: 10 AM  No solid food after midnight prior to cath, clear liquids until 5 AM day of procedure. Verify allergies in Epic  Hold: Metformin -day of procedure and 48 hours post procedure. Spironolactone day before and day of procedure. Lisinopril-HCT day before and day of procedure.  Except hold medications AM meds can be  taken pre-cath with sip of water including: ASA 81 mg  Confirm patient has responsible person to drive home post procedure and for 24 hours after you arrive home-yes   I discussed instructions, pt verbalized understanding, thanked me for call. LMTCB to discuss with patient.

## 2017-11-30 ENCOUNTER — Inpatient Hospital Stay (HOSPITAL_COMMUNITY)
Admission: AD | Admit: 2017-11-30 | Discharge: 2017-12-01 | DRG: 247 | Disposition: A | Discharge status: Home / Self Care | Payer: Medicare HMO | Attending: Interventional Cardiology | Admitting: Interventional Cardiology

## 2017-11-30 ENCOUNTER — Inpatient Hospital Stay (HOSPITAL_COMMUNITY)
Admission: AD | Disposition: A | Discharge status: Home / Self Care | Payer: Self-pay | Source: Home / Self Care | Attending: Interventional Cardiology

## 2017-11-30 ENCOUNTER — Other Ambulatory Visit: Payer: Self-pay

## 2017-11-30 ENCOUNTER — Encounter (HOSPITAL_COMMUNITY): Payer: Self-pay | Admitting: Interventional Cardiology

## 2017-11-30 DIAGNOSIS — E663 Overweight: Secondary | ICD-10-CM | POA: Diagnosis present

## 2017-11-30 DIAGNOSIS — Z7951 Long term (current) use of inhaled steroids: Secondary | ICD-10-CM

## 2017-11-30 DIAGNOSIS — Z79899 Other long term (current) drug therapy: Secondary | ICD-10-CM

## 2017-11-30 DIAGNOSIS — Z6827 Body mass index (BMI) 27.0-27.9, adult: Secondary | ICD-10-CM

## 2017-11-30 DIAGNOSIS — I252 Old myocardial infarction: Secondary | ICD-10-CM

## 2017-11-30 DIAGNOSIS — K219 Gastro-esophageal reflux disease without esophagitis: Secondary | ICD-10-CM | POA: Diagnosis present

## 2017-11-30 DIAGNOSIS — Z7984 Long term (current) use of oral hypoglycemic drugs: Secondary | ICD-10-CM

## 2017-11-30 DIAGNOSIS — I25119 Atherosclerotic heart disease of native coronary artery with unspecified angina pectoris: Secondary | ICD-10-CM | POA: Diagnosis not present

## 2017-11-30 DIAGNOSIS — E785 Hyperlipidemia, unspecified: Secondary | ICD-10-CM | POA: Diagnosis present

## 2017-11-30 DIAGNOSIS — Z955 Presence of coronary angioplasty implant and graft: Secondary | ICD-10-CM

## 2017-11-30 DIAGNOSIS — I2510 Atherosclerotic heart disease of native coronary artery without angina pectoris: Principal | ICD-10-CM | POA: Diagnosis present

## 2017-11-30 DIAGNOSIS — J439 Emphysema, unspecified: Secondary | ICD-10-CM | POA: Diagnosis present

## 2017-11-30 DIAGNOSIS — I110 Hypertensive heart disease with heart failure: Secondary | ICD-10-CM | POA: Diagnosis present

## 2017-11-30 DIAGNOSIS — I5022 Chronic systolic (congestive) heart failure: Secondary | ICD-10-CM | POA: Diagnosis present

## 2017-11-30 DIAGNOSIS — I209 Angina pectoris, unspecified: Secondary | ICD-10-CM

## 2017-11-30 DIAGNOSIS — E119 Type 2 diabetes mellitus without complications: Secondary | ICD-10-CM | POA: Diagnosis present

## 2017-11-30 DIAGNOSIS — I10 Essential (primary) hypertension: Secondary | ICD-10-CM | POA: Diagnosis present

## 2017-11-30 HISTORY — PX: CORONARY STENT INTERVENTION: CATH118234

## 2017-11-30 HISTORY — PX: LEFT HEART CATH AND CORONARY ANGIOGRAPHY: CATH118249

## 2017-11-30 LAB — GLUCOSE, CAPILLARY
Glucose-Capillary: 235 mg/dL — ABNORMAL HIGH (ref 70–99)
Glucose-Capillary: 199 mg/dL — ABNORMAL HIGH (ref 70–99)

## 2017-11-30 LAB — POCT ACTIVATED CLOTTING TIME
Activated Clotting Time: 279 seconds
Activated Clotting Time: 252 seconds

## 2017-11-30 SURGERY — LEFT HEART CATH AND CORONARY ANGIOGRAPHY
Anesthesia: LOCAL

## 2017-11-30 MED ORDER — SODIUM CHLORIDE 0.9 % IV SOLN: INTRAVENOUS | Status: AC

## 2017-11-30 MED ORDER — CARVEDILOL 12.5 MG PO TABS
12.5000 mg | ORAL_TABLET | Freq: Two times a day (BID) | ORAL | Status: DC
  Administered 2017-11-30 – 2017-12-01 (×2): 12.5 mg via ORAL
  Filled 2017-11-30 (×2): qty 1

## 2017-11-30 MED ORDER — ANGIOPLASTY BOOK
Freq: Once | Status: AC
  Administered 2017-12-01
  Filled 2017-11-30: qty 1

## 2017-11-30 MED ORDER — HEPARIN (PORCINE) IN NACL 1000-0.9 UT/500ML-% IV SOLN
INTRAVENOUS | Status: AC
  Filled 2017-11-30: qty 1000

## 2017-11-30 MED ORDER — NITROGLYCERIN 0.4 MG SL SUBL: 0.4000 mg | SUBLINGUAL_TABLET | SUBLINGUAL | Status: DC | PRN

## 2017-11-30 MED ORDER — ONDANSETRON HCL 4 MG/2ML IJ SOLN: 4.0000 mg | Freq: Four times a day (QID) | INTRAMUSCULAR | Status: DC | PRN

## 2017-11-30 MED ORDER — SODIUM CHLORIDE 0.9% FLUSH: 3.0000 mL | INTRAVENOUS | Status: DC | PRN

## 2017-11-30 MED ORDER — ASPIRIN 81 MG PO CHEW
81.0000 mg | CHEWABLE_TABLET | ORAL | Status: AC
  Administered 2017-11-30: 81 mg via ORAL
  Filled 2017-11-30: qty 1

## 2017-11-30 MED ORDER — MIDAZOLAM HCL 2 MG/2ML IJ SOLN
INTRAMUSCULAR | Status: DC | PRN
  Administered 2017-11-30: 2 mg via INTRAVENOUS

## 2017-11-30 MED ORDER — SODIUM CHLORIDE 0.9 % IV SOLN: 250.0000 mL | INTRAVENOUS | Status: DC | PRN

## 2017-11-30 MED ORDER — MIDAZOLAM HCL 2 MG/2ML IJ SOLN
INTRAMUSCULAR | Status: AC
  Filled 2017-11-30: qty 2

## 2017-11-30 MED ORDER — MOMETASONE FURO-FORMOTEROL FUM 200-5 MCG/ACT IN AERO
2.0000 | INHALATION_SPRAY | Freq: Two times a day (BID) | RESPIRATORY_TRACT | Status: DC
  Administered 2017-11-30: 2 via RESPIRATORY_TRACT
  Filled 2017-11-30: qty 8.8

## 2017-11-30 MED ORDER — OXYBUTYNIN CHLORIDE 5 MG PO TABS: 5.0000 mg | ORAL_TABLET | Freq: Three times a day (TID) | ORAL | Status: DC | PRN

## 2017-11-30 MED ORDER — TIROFIBAN HCL IV 12.5 MG/250 ML
INTRAVENOUS | Status: AC
  Filled 2017-11-30: qty 250

## 2017-11-30 MED ORDER — CLOPIDOGREL BISULFATE 300 MG PO TABS
ORAL_TABLET | ORAL | Status: AC
  Filled 2017-11-30: qty 1

## 2017-11-30 MED ORDER — FENTANYL CITRATE (PF) 100 MCG/2ML IJ SOLN
INTRAMUSCULAR | Status: DC | PRN
  Administered 2017-11-30: 25 ug via INTRAVENOUS

## 2017-11-30 MED ORDER — AMLODIPINE BESYLATE 5 MG PO TABS
5.0000 mg | ORAL_TABLET | Freq: Every day | ORAL | Status: DC
  Administered 2017-12-01: 5 mg via ORAL
  Filled 2017-11-30: qty 1

## 2017-11-30 MED ORDER — TIROFIBAN (AGGRASTAT) BOLUS VIA INFUSION
INTRAVENOUS | Status: DC | PRN
  Administered 2017-11-30: 2437.5 ug via INTRAVENOUS

## 2017-11-30 MED ORDER — NITROGLYCERIN 1 MG/10 ML FOR IR/CATH LAB
INTRA_ARTERIAL | Status: DC | PRN
  Administered 2017-11-30: 200 ug
  Administered 2017-11-30: 200 ug via INTRA_ARTERIAL

## 2017-11-30 MED ORDER — HYDRALAZINE HCL 20 MG/ML IJ SOLN: 5.0000 mg | INTRAMUSCULAR | Status: AC | PRN

## 2017-11-30 MED ORDER — SODIUM CHLORIDE 0.9% FLUSH: 3.0000 mL | Freq: Two times a day (BID) | INTRAVENOUS | Status: DC

## 2017-11-30 MED ORDER — HEPARIN SODIUM (PORCINE) 1000 UNIT/ML IJ SOLN
INTRAMUSCULAR | Status: DC | PRN
  Administered 2017-11-30: 7000 [IU] via INTRAVENOUS
  Administered 2017-11-30: 2000 [IU] via INTRAVENOUS

## 2017-11-30 MED ORDER — SODIUM CHLORIDE 0.9 % WEIGHT BASED INFUSION: 1.0000 mL/kg/h | INTRAVENOUS | Status: DC

## 2017-11-30 MED ORDER — HEPARIN (PORCINE) IN NACL 1000-0.9 UT/500ML-% IV SOLN
INTRAVENOUS | Status: DC | PRN
  Administered 2017-11-30: 500 mL

## 2017-11-30 MED ORDER — NITROGLYCERIN 1 MG/10 ML FOR IR/CATH LAB
INTRA_ARTERIAL | Status: AC
  Filled 2017-11-30: qty 10

## 2017-11-30 MED ORDER — FINASTERIDE 5 MG PO TABS
5.0000 mg | ORAL_TABLET | Freq: Every day | ORAL | Status: DC
  Administered 2017-12-01: 11:00:00 5 mg via ORAL
  Filled 2017-11-30: qty 1

## 2017-11-30 MED ORDER — ALLOPURINOL 300 MG PO TABS
300.0000 mg | ORAL_TABLET | Freq: Every day | ORAL | Status: DC
  Administered 2017-12-01: 12:00:00 300 mg via ORAL
  Filled 2017-11-30: qty 1

## 2017-11-30 MED ORDER — VERAPAMIL HCL 2.5 MG/ML IV SOLN
INTRAVENOUS | Status: DC | PRN
  Administered 2017-11-30: 10 mL via INTRA_ARTERIAL

## 2017-11-30 MED ORDER — LATANOPROST 0.005 % OP SOLN
1.0000 [drp] | Freq: Every day | OPHTHALMIC | Status: DC
  Administered 2017-11-30: 22:00:00 1 [drp] via OPHTHALMIC
  Filled 2017-11-30: qty 2.5

## 2017-11-30 MED ORDER — OXYCODONE HCL 5 MG PO TABS: 5.0000 mg | ORAL_TABLET | ORAL | Status: DC | PRN

## 2017-11-30 MED ORDER — FENTANYL CITRATE (PF) 100 MCG/2ML IJ SOLN
INTRAMUSCULAR | Status: AC
  Filled 2017-11-30: qty 2

## 2017-11-30 MED ORDER — SODIUM CHLORIDE 0.9 % WEIGHT BASED INFUSION
3.0000 mL/kg/h | INTRAVENOUS | Status: DC
  Administered 2017-11-30: 3 mL/kg/h via INTRAVENOUS

## 2017-11-30 MED ORDER — LIDOCAINE HCL (PF) 1 % IJ SOLN
INTRAMUSCULAR | Status: AC
  Filled 2017-11-30: qty 30

## 2017-11-30 MED ORDER — ACETAMINOPHEN 325 MG PO TABS
650.0000 mg | ORAL_TABLET | ORAL | Status: DC | PRN
Start: 2017-11-30 — End: 2017-12-01

## 2017-11-30 MED ORDER — CLOPIDOGREL BISULFATE 300 MG PO TABS
ORAL_TABLET | ORAL | Status: DC | PRN
  Administered 2017-11-30: 600 mg via ORAL

## 2017-11-30 MED ORDER — LABETALOL HCL 5 MG/ML IV SOLN: 10.0000 mg | INTRAVENOUS | Status: AC | PRN

## 2017-11-30 MED ORDER — VERAPAMIL HCL 2.5 MG/ML IV SOLN
INTRAVENOUS | Status: AC
  Filled 2017-11-30: qty 2

## 2017-11-30 MED ORDER — HYDROCHLOROTHIAZIDE 12.5 MG PO CAPS
12.5000 mg | ORAL_CAPSULE | Freq: Every day | ORAL | Status: DC
  Administered 2017-12-01: 12.5 mg via ORAL
  Filled 2017-11-30: qty 1

## 2017-11-30 MED ORDER — HEPARIN SODIUM (PORCINE) 1000 UNIT/ML IJ SOLN
INTRAMUSCULAR | Status: AC
  Filled 2017-11-30: qty 1

## 2017-11-30 MED ORDER — LISINOPRIL 10 MG PO TABS
20.0000 mg | ORAL_TABLET | Freq: Every day | ORAL | Status: DC
  Administered 2017-12-01: 11:00:00 20 mg via ORAL
  Filled 2017-11-30: qty 2

## 2017-11-30 MED ORDER — SPIRONOLACTONE 25 MG PO TABS
25.0000 mg | ORAL_TABLET | Freq: Every day | ORAL | Status: DC
  Administered 2017-12-01: 25 mg via ORAL
  Filled 2017-11-30: qty 1

## 2017-11-30 MED ORDER — ROSUVASTATIN CALCIUM 40 MG PO TABS
40.0000 mg | ORAL_TABLET | Freq: Every day | ORAL | Status: DC
  Administered 2017-12-01: 11:00:00 40 mg via ORAL
  Filled 2017-11-30: qty 2
  Filled 2017-11-30: qty 1

## 2017-11-30 MED ORDER — LIDOCAINE HCL (PF) 1 % IJ SOLN
INTRAMUSCULAR | Status: DC | PRN
  Administered 2017-11-30: 2 mL via INTRADERMAL

## 2017-11-30 MED ORDER — ASPIRIN 81 MG PO CHEW
81.0000 mg | CHEWABLE_TABLET | Freq: Every day | ORAL | Status: DC
  Administered 2017-12-01: 81 mg via ORAL
  Filled 2017-11-30: qty 1

## 2017-11-30 MED ORDER — ASPIRIN EC 81 MG PO TBEC
81.0000 mg | DELAYED_RELEASE_TABLET | Freq: Every day | ORAL | Status: DC
  Filled 2017-11-30: qty 1

## 2017-11-30 MED ORDER — CLOPIDOGREL BISULFATE 75 MG PO TABS
75.0000 mg | ORAL_TABLET | Freq: Every day | ORAL | Status: DC
  Administered 2017-12-01: 75 mg via ORAL
  Filled 2017-11-30: qty 1

## 2017-11-30 MED ORDER — LISINOPRIL-HYDROCHLOROTHIAZIDE 20-12.5 MG PO TABS: 1.0000 | ORAL_TABLET | Freq: Every day | ORAL | Status: DC

## 2017-11-30 SURGICAL SUPPLY — 18 items
BALLN SAPPHIRE 2.25X20 (BALLOONS) ×2
BALLOON SAPPHIRE 2.25X20 (BALLOONS) ×1 IMPLANT
CATH 5FR JL3.5 JR4 ANG PIG MP (CATHETERS) ×2 IMPLANT
CATH LAUNCHER 6FR EBU 3 (CATHETERS) ×2 IMPLANT
DEVICE RAD COMP TR BAND LRG (VASCULAR PRODUCTS) ×2 IMPLANT
GLIDESHEATH SLEND SS 6F .021 (SHEATH) ×2 IMPLANT
GUIDEWIRE INQWIRE 1.5J.035X260 (WIRE) ×1 IMPLANT
INQWIRE 1.5J .035X260CM (WIRE) ×2
KIT ENCORE 26 ADVANTAGE (KITS) ×2 IMPLANT
KIT HEART LEFT (KITS) ×2 IMPLANT
KIT HEMO VALVE WATCHDOG (MISCELLANEOUS) ×2 IMPLANT
PACK CARDIAC CATHETERIZATION (CUSTOM PROCEDURE TRAY) ×2 IMPLANT
STENT SYNERGY DES 2.5X32 (Permanent Stent) ×2 IMPLANT
STENT SYNERGY DES 2.75X20 (Permanent Stent) ×2 IMPLANT
TRANSDUCER W/STOPCOCK (MISCELLANEOUS) ×2 IMPLANT
TUBING CIL FLEX 10 FLL-RA (TUBING) ×2 IMPLANT
WIRE ASAHI FIELDER XT 190CM (WIRE) ×2 IMPLANT
WIRE HI TORQ BMW 190CM (WIRE) ×2 IMPLANT

## 2017-11-30 NOTE — Progress Notes (Signed)
1405 Pt arrived via bed to 6C07, small, soft lump felt, posterior to site. Per cath lab techs, no need for intervention at this time. Pt A&Ox4, denies pain, pt oriented to room and updated with POC, WCTM.   1600 Lump getting larger, charge RN Mickel Baas informed, cath lab called and supervisor came up to look at site. No need for intervention, stated TR band can start to be deflated. WCTM.

## 2017-11-30 NOTE — Progress Notes (Signed)
Instructed pt not to take metformin for 48 hours after procedure voices understanding.

## 2017-11-30 NOTE — Interval H&P Note (Signed)
Cath Lab Visit (complete for each Cath Lab visit)  Clinical Evaluation Leading to the Procedure:   ACS: No.  Non-ACS:    Anginal Classification: CCS III  Anti-ischemic medical therapy: Minimal Therapy (1 class of medications)  Non-Invasive Test Results: High-risk stress test findings: cardiac mortality >3%/year  Prior CABG: No previous CABG      History and Physical Interval Note:  11/30/2017 12:43 PM  Mark Burgess  has presented today for surgery, with the diagnosis of abnormal stress test  The various methods of treatment have been discussed with the patient and family. After consideration of risks, benefits and other options for treatment, the patient has consented to  Procedure(s): LEFT HEART CATH AND CORONARY ANGIOGRAPHY (N/A) as a surgical intervention .  The patient's history has been reviewed, patient examined, no change in status, stable for surgery.  I have reviewed the patient's chart and labs.  Questions were answered to the patient's satisfaction.     Larae Grooms

## 2017-12-01 ENCOUNTER — Encounter (HOSPITAL_COMMUNITY): Payer: Self-pay | Admitting: Cardiology

## 2017-12-01 ENCOUNTER — Telehealth: Payer: Self-pay | Admitting: Cardiology

## 2017-12-01 DIAGNOSIS — I2510 Atherosclerotic heart disease of native coronary artery without angina pectoris: Principal | ICD-10-CM

## 2017-12-01 DIAGNOSIS — E663 Overweight: Secondary | ICD-10-CM | POA: Diagnosis present

## 2017-12-01 DIAGNOSIS — Z955 Presence of coronary angioplasty implant and graft: Secondary | ICD-10-CM | POA: Diagnosis not present

## 2017-12-01 DIAGNOSIS — I110 Hypertensive heart disease with heart failure: Secondary | ICD-10-CM | POA: Diagnosis present

## 2017-12-01 DIAGNOSIS — R0602 Shortness of breath: Secondary | ICD-10-CM | POA: Diagnosis present

## 2017-12-01 DIAGNOSIS — I10 Essential (primary) hypertension: Secondary | ICD-10-CM | POA: Diagnosis not present

## 2017-12-01 DIAGNOSIS — K219 Gastro-esophageal reflux disease without esophagitis: Secondary | ICD-10-CM | POA: Diagnosis present

## 2017-12-01 DIAGNOSIS — Z7951 Long term (current) use of inhaled steroids: Secondary | ICD-10-CM | POA: Diagnosis not present

## 2017-12-01 DIAGNOSIS — I5022 Chronic systolic (congestive) heart failure: Secondary | ICD-10-CM | POA: Diagnosis present

## 2017-12-01 DIAGNOSIS — I252 Old myocardial infarction: Secondary | ICD-10-CM | POA: Diagnosis not present

## 2017-12-01 DIAGNOSIS — Z6827 Body mass index (BMI) 27.0-27.9, adult: Secondary | ICD-10-CM | POA: Diagnosis not present

## 2017-12-01 DIAGNOSIS — J439 Emphysema, unspecified: Secondary | ICD-10-CM | POA: Diagnosis present

## 2017-12-01 DIAGNOSIS — E119 Type 2 diabetes mellitus without complications: Secondary | ICD-10-CM | POA: Diagnosis present

## 2017-12-01 DIAGNOSIS — E7800 Pure hypercholesterolemia, unspecified: Secondary | ICD-10-CM

## 2017-12-01 DIAGNOSIS — E785 Hyperlipidemia, unspecified: Secondary | ICD-10-CM | POA: Diagnosis present

## 2017-12-01 DIAGNOSIS — Z7984 Long term (current) use of oral hypoglycemic drugs: Secondary | ICD-10-CM | POA: Diagnosis not present

## 2017-12-01 DIAGNOSIS — Z79899 Other long term (current) drug therapy: Secondary | ICD-10-CM | POA: Diagnosis not present

## 2017-12-01 LAB — CBC
HCT: 36.1 % — ABNORMAL LOW (ref 39.0–52.0)
Hemoglobin: 11.7 g/dL — ABNORMAL LOW (ref 13.0–17.0)
MCH: 29.3 pg (ref 26.0–34.0)
MCHC: 32.4 g/dL (ref 30.0–36.0)
MCV: 90.5 fL (ref 78.0–100.0)
Platelets: 156 10*3/uL (ref 150–400)
RBC: 3.99 MIL/uL — AB (ref 4.22–5.81)
RDW: 13.7 % (ref 11.5–15.5)
WBC: 7.9 10*3/uL (ref 4.0–10.5)

## 2017-12-01 LAB — BASIC METABOLIC PANEL
Anion gap: 9 (ref 5–15)
BUN: 19 mg/dL (ref 8–23)
CALCIUM: 10 mg/dL (ref 8.9–10.3)
CHLORIDE: 105 mmol/L (ref 98–111)
CO2: 25 mmol/L (ref 22–32)
Creatinine, Ser: 1.32 mg/dL — ABNORMAL HIGH (ref 0.61–1.24)
GFR calc non Af Amer: 51 mL/min — ABNORMAL LOW (ref >60)
GFR, EST AFRICAN AMERICAN: 59 mL/min — AB (ref >60)
Glucose, Bld: 209 mg/dL — ABNORMAL HIGH (ref 70–99)
Potassium: 3.9 mmol/L (ref 3.5–5.1)
Sodium: 139 mmol/L (ref 135–145)

## 2017-12-01 LAB — GLUCOSE, CAPILLARY
GLUCOSE-CAPILLARY: 186 mg/dL — AB (ref 70–99)
Glucose-Capillary: 188 mg/dL — ABNORMAL HIGH (ref 70–99)

## 2017-12-01 MED ORDER — CLOPIDOGREL BISULFATE 75 MG PO TABS: 75.0000 mg | ORAL_TABLET | Freq: Every day | ORAL | 2 refills | Status: DC

## 2017-12-01 MED ORDER — PANTOPRAZOLE SODIUM 40 MG PO TBEC: 40.0000 mg | DELAYED_RELEASE_TABLET | Freq: Every day | ORAL | 1 refills | Status: DC

## 2017-12-01 MED FILL — Tirofiban HCl in NaCl 0.9% IV Soln 12.5 MG/250ML (Base Eq): INTRAVENOUS | Qty: 250 | Status: AC

## 2017-12-01 NOTE — Progress Notes (Signed)
CARDIAC REHAB PHASE I   PRE:  Rate/Rhythm: 71 SR    BP: sitting 159/86    SaO2:   MODE:  Ambulation: 550 ft   POST:  Rate/Rhythm: 91 SR    BP: sitting 183/67     SaO2:   Tolerated well. Sts he is not SOB now like he was PTA. Ed completed with pt and wife, good reception. Understands Plavix/ASA, increasing ex, NTG, and CRPII. Will send referral to [facility]. Encouraged pt to maintain his DM.  [id number]   [place], ACSM 12/01/2017 10:11 AM

## 2017-12-01 NOTE — Progress Notes (Signed)
Progress Note  Patient Name: Mark Burgess Date of Encounter: 12/01/2017  Primary Cardiologist: Dr Percival Spanish   Subjective   Postop day #1 distal LAD PCI and drug-eluting stenting using a synergy drug-eluting stent by Dr. Irish Lack.  This was performed radially.  The patient is asymptomatic.  Inpatient Medications    Scheduled Meds: . allopurinol  300 mg Oral Daily  . amLODipine  5 mg Oral Daily  . aspirin  81 mg Oral Daily  . aspirin EC  81 mg Oral Daily  . carvedilol  12.5 mg Oral BID  . clopidogrel  75 mg Oral Q breakfast  . finasteride  5 mg Oral Daily  . lisinopril  20 mg Oral Daily   And  . hydrochlorothiazide  12.5 mg Oral Daily  . latanoprost  1 drop Both Eyes QHS  . mometasone-formoterol  2 puff Inhalation BID  . rosuvastatin  40 mg Oral Daily  . sodium chloride flush  3 mL Intravenous Q12H  . spironolactone  25 mg Oral Daily   Continuous Infusions: . sodium chloride     PRN Meds: sodium chloride, acetaminophen, nitroGLYCERIN, ondansetron (ZOFRAN) IV, oxybutynin, oxyCODONE, sodium chloride flush   Vital Signs    Vitals:   11/30/17 1949 11/30/17 2155 12/01/17 0403 12/01/17 0822  BP:    136/76  Pulse:  71  70  Resp:    17  Temp: 98.2 F (36.8 C)  98 F (36.7 C) 97.7 F (36.5 C)  TempSrc: Oral  Oral   SpO2: 98%   97%  Weight:   209 lb 8 oz (95 kg)   Height:        Intake/Output Summary (Last 24 hours) at 12/01/2017 1041 Last data filed at 12/01/2017 0700 Gross per 24 hour  Intake 1907.05 ml  Output 850 ml  Net 1057.05 ml   Filed Weights   11/30/17 1021 11/30/17 1425 12/01/17 0403  Weight: 215 lb (97.5 kg) 216 lb 11.4 oz (98.3 kg) 209 lb 8 oz (95 kg)    Telemetry    Normal sinus rhythm- Personally Reviewed  ECG    Normal sinus rhythm at 65 without ST or T wave changes- Personally Reviewed  Physical Exam   GEN: No acute distress.   Neck: No JVD Cardiac: RRR, no murmurs, rubs, or gallops.  Respiratory: Clear to auscultation  bilaterally. GI: Soft, nontender, non-distended  MS: No edema; No deformity. Neuro:  Nonfocal  Psych: Normal affect  Extremity: Right radial puncture site appears stable   Labs    Chemistry Recent Labs  Lab 11/25/17 1240 12/01/17 0419  NA 140 139  K 4.6 3.9  CL 100 105  CO2 23 25  GLUCOSE 272* 209*  BUN 30* 19  CREATININE 1.36* 1.32*  CALCIUM 10.6* 10.0  GFRNONAA 51* 51*  GFRAA 58* 59*  ANIONGAP  --  9     Hematology Recent Labs  Lab 11/25/17 1240 12/01/17 0419  WBC 9.1 7.9  RBC 4.35 3.99*  HGB 12.8* 11.7*  HCT 38.6 36.1*  MCV 89 90.5  MCH 29.4 29.3  MCHC 33.2 32.4  RDW 14.0 13.7  PLT 218 156    Cardiac EnzymesNo results for input(s): TROPONINI in the last 168 hours. No results for input(s): TROPIPOC in the last 168 hours.   BNPNo results for input(s): BNP, PROBNP in the last 168 hours.   DDimer No results for input(s): DDIMER in the last 168 hours.   Radiology    No results found.  Cardiac Studies  Cardiac catheterization/PCI and stent report (11/30/2017)  Conclusion     Previously placed Mid Cx stent (unknown type) is widely patent.  Ost Cx to Prox Cx lesion is 25% stenosed.  Prox LAD lesion is 25% stenosed.  Mid LAD lesion is 99% stenosed.  Mid LAD to Dist LAD lesion is 70% stenosed.  A drug-eluting stent was successfully placed using a STENT SYNERGY DES 2.5X32.  A drug-eluting stent was successfully placed using a STENT SYNERGY DES 2.75X20, overlapping the first stent.  Post intervention, there is a 0% residual stenosis.  Dist LAD lesion is 75% stenosed. This is too far distal for PCI.   Recommend uninterrupted dual antiplatelet therapy with Aspirin 81mg  daily and Clopidogrel 75mg  daily for a minimum of 6 months (stable ischemic heart disease - Class I recommendation).  Continue aggressive secondary prevention.      Patient Profile     77 y.o. male with prior history of circumflex PCI and stenting in 2012.  He does have  treated hypertension, hyperlipidemia and diabetes.  He was noticed to have increasing dyspnea on exertion and underwent a PO ET (plain old exercise treadmill test) by Dr. Percival Spanish which was abnormal.  Cardiac catheterization was recommended.  Assessment & Plan    1: Coronary artery disease- status post left heart cath via right radial approach yesterday by Dr. Irish Lack.  Circumflex stent was patent.  There was high-grade distal LAD disease with which was stented with 2 synergy drug-eluting stents.  His LV function appeared essentially normal.  He will need dual antiplatelet therapy per Dr. Hassell Done note for at least 6 months.  2: Essential hypertension- controlled on current medications  3: Hyperlipidemia- FLP not at goal and statins were switched to Crestor per Dr. Percival Spanish which he will follow.   Stable for discharge home today, follow-up with Dr. Percival Spanish.  For questions or updates, please contact [place] Please consult [email or web address] for contact info under Cardiology/STEMI.      Signed, Quay Burow, MD  12/01/2017, 10:41 AM

## 2017-12-01 NOTE — Discharge Summary (Addendum)
Discharge Summary    Patient ID: Mark Burgess,  MRN: [id number], DOB/AGE: 1940-10-19 77 y.o.  Admit date: 11/30/2017 Discharge date: 12/01/2017  Primary Care Provider: Kirk Burgess Primary Cardiologist: Dr. Percival Burgess   Discharge Diagnoses    Active Problems:   Angina pectoris [facility])   CAD (coronary artery disease)   Essential hypertension, benign   Chronic systolic heart failure ([place])   Hyperlipidemia   Allergies No Known Allergies  Diagnostic Studies/Procedures    Cath: 11/30/17   Previously placed Mid Cx stent (unknown type) is widely patent.  Ost Cx to Prox Cx lesion is 25% stenosed.  Prox LAD lesion is 25% stenosed.  Mid LAD lesion is 99% stenosed.  Mid LAD to Dist LAD lesion is 70% stenosed.  A drug-eluting stent was successfully placed using a STENT SYNERGY DES 2.5X32.  A drug-eluting stent was successfully placed using a STENT SYNERGY DES 2.75X20, overlapping the first stent.  Post intervention, there is a 0% residual stenosis.  Dist LAD lesion is 75% stenosed. This is too far distal for PCI.   Recommend uninterrupted dual antiplatelet therapy with Aspirin 81mg  daily and Clopidogrel 75mg  daily for a minimum of 6 months (stable ischemic heart disease - Class I recommendation).  Continue aggressive secondary prevention.  _____________   History of Present Illness     77 y.o. male who presented for follow-up of mild left ventricular dysfunction and coronary artery disease. He is followed by Dr. Percival Burgess as outpatient. At the last visit he was screened with a POET (Plain Old Exercise Treadmill).  He had ST depression in stage II.  He was quite short of breath even at the beginning of stage II with this.  In retrospect he did get more short of breath with activities and this was more than he described previously.  He was not getting any chest pressure, neck or arm discomfort.  He was not having any palpitations, presyncope or syncope. He did  not have any resting shortness of breath, PND or orthopnea. Given his symptoms and findings on exercise stress test, he was referred for outpatient cath.   Hospital Course     Underwent cath noted above with PCI/DES to dLAD with stents x2. Lcx stent was patent. LV gram noted essentially normal EF. Plan for DAPT with ASA/plavix for at least 6 months. Recent FLP was not at goal, and switched to Crestor per Dr. Percival Burgess. Will need follow up FLP/LFTs. Morning labs were stable post cath. Worked well with cardiac rehab, without recurrent chest pain. Of note his home prilosec was switched to protonix at the time of discharge.   Mark Burgess was seen by Dr. Gwenlyn Burgess and determined stable for discharge home. Follow up in the office has been arranged. Medications are listed below.   _____________  Discharge Vitals Blood pressure 136/76, pulse 70, temperature 97.7 F (36.5 C), resp. rate 17, height 6\' 2"  (1.88 m), weight 209 lb 8 oz (95 kg), SpO2 97 %.  Filed Weights   11/30/17 1021 11/30/17 1425 12/01/17 0403  Weight: 215 lb (97.5 kg) 216 lb 11.4 oz (98.3 kg) 209 lb 8 oz (95 kg)    Labs & Radiologic Studies    CBC Recent Labs    12/01/17 0419  WBC 7.9  HGB 11.7*  HCT 36.1*  MCV 90.5  PLT 315   Basic Metabolic Panel Recent Labs    12/01/17 0419  NA 139  K 3.9  CL 105  CO2 25  GLUCOSE  209*  BUN 19  CREATININE 1.32*  CALCIUM 10.0   Liver Function Tests No results for input(s): AST, ALT, ALKPHOS, BILITOT, PROT, ALBUMIN in the last 72 hours. No results for input(s): LIPASE, AMYLASE in the last 72 hours. Cardiac Enzymes No results for input(s): CKTOTAL, CKMB, CKMBINDEX, TROPONINI in the last 72 hours. BNP Invalid input(s): POCBNP D-Dimer No results for input(s): DDIMER in the last 72 hours. Hemoglobin A1C No results for input(s): HGBA1C in the last 72 hours. Fasting Lipid Panel No results for input(s): CHOL, HDL, LDLCALC, TRIG, CHOLHDL, LDLDIRECT in the last 72  hours. Thyroid Function Tests No results for input(s): TSH, T4TOTAL, T3FREE, THYROIDAB in the last 72 hours.  Invalid input(s): FREET3 _____________  No results Burgess. Disposition   Pt is being discharged home today in good condition.   Follow-up Plans & Appointments   Follow-up Information    Mark Breeding, MD Follow up on 12/16/2017.   Specialty:  Cardiology Why:  Please arrive 15 minutes early for your 11:40am appointment Contact information: [place] [address] [phone]          Discharge Instructions    Amb Referral to Cardiac Rehabilitation   Complete by:  As directed    Diagnosis:   Coronary Stents PTCA     Call MD for:  redness, tenderness, or signs of infection (pain, swelling, redness, odor or green/yellow discharge around incision site)   Complete by:  As directed    Diet - low sodium heart healthy   Complete by:  As directed    Discharge instructions   Complete by:  As directed    Radial Site Care Refer to this sheet in the next few weeks. These instructions provide you with information on caring for yourself after your procedure. Your caregiver may also give you more specific instructions. Your treatment has been planned according to current medical practices, but problems sometimes occur. Call your caregiver if you have any problems or questions after your procedure. HOME CARE INSTRUCTIONS You may shower the day after the procedure.Remove the bandage (dressing) and gently wash the site with plain soap and water.Gently pat the site dry.  Do not apply powder or lotion to the site.  Do not submerge the affected site in water for 3 to 5 days.  Inspect the site at least twice daily.  Do not flex or bend the affected arm for 24 hours.  No lifting over 5 pounds (2.3 kg) for 5 days after your procedure.  Do not drive home if you are discharged the same day of the procedure. Have someone else drive you.  You may drive 24 hours  after the procedure unless otherwise instructed by your caregiver.  What to expect: Any bruising will usually fade within 1 to 2 weeks.  Blood that collects in the tissue (hematoma) may be painful to the touch. It should usually decrease in size and tenderness within 1 to 2 weeks.  SEEK IMMEDIATE MEDICAL CARE IF: You have unusual pain at the radial site.  You have redness, warmth, swelling, or pain at the radial site.  You have drainage (other than a small amount of blood on the dressing).  You have chills.  You have a fever or persistent symptoms for more than 72 hours.  You have a fever and your symptoms suddenly get worse.  Your arm becomes pale, cool, tingly, or numb.  You have heavy bleeding from the site. Hold pressure on the site.   PLEASE  DO NOT MISS ANY DOSES OF YOUR PLAVIX!!!!! Also keep a log of you blood pressures and bring back to your follow up appt. Please call the office with any questions.   Patients taking blood thinners should generally stay away from medicines like ibuprofen, Advil, Motrin, naproxen, and Aleve due to risk of stomach bleeding. You may take Tylenol as directed or talk to your primary doctor about alternatives.  Some studies suggest Prilosec/Omeprazole interacts with Plavix. We changed your Prilosec/Omeprazole to the equivalent dose of Protonix for less chance of interaction.   Increase activity slowly   Complete by:  As directed      Discharge Medications     Medication List    STOP taking these medications   omeprazole 40 MG capsule Commonly known as:  PRILOSEC   oxybutynin 5 MG tablet Commonly known as:  DITROPAN   oxyCODONE 5 MG immediate release tablet Commonly known as:  ROXICODONE   traMADol 50 MG tablet Commonly known as:  ULTRAM     TAKE these medications   ADVAIR HFA 115-21 MCG/ACT inhaler Generic drug:  fluticasone-salmeterol Inhale 2 puffs into the lungs every 12 (twelve) hours. (0900 & 2100)   allopurinol 300 MG  tablet Commonly known as:  ZYLOPRIM Take 300 mg by mouth daily.   amLODipine 5 MG tablet Commonly known as:  NORVASC Take 5 mg by mouth daily.   aspirin EC 81 MG tablet Take 81 mg by mouth daily.   carvedilol 12.5 MG tablet Commonly known as:  COREG Take 1 tablet (12.5 mg total) by mouth 2 (two) times daily.   CENTRUM MEN PO Take 1 tablet by mouth daily.   clopidogrel 75 MG tablet Commonly known as:  PLAVIX Take 1 tablet (75 mg total) by mouth daily with breakfast.   finasteride 5 MG tablet Commonly known as:  PROSCAR Take 5 mg by mouth daily.   latanoprost 0.005 % ophthalmic solution Commonly known as:  XALATAN Place 1 drop into both eyes at bedtime.   lisinopril-hydrochlorothiazide 20-12.5 MG tablet Commonly known as:  PRINZIDE,ZESTORETIC Take 1 tablet by mouth daily.   metFORMIN 500 MG 24 hr tablet Commonly known as:  GLUCOPHAGE-XR Take 500 mg by mouth 2 (two) times daily.   nitroGLYCERIN 0.4 MG SL tablet Commonly known as:  NITROSTAT PLACE 1 TABLET (0.4 MG TOTAL) UNDER THE TONGUE EVERY 5 (FIVE) MINUTES AS NEEDED.   pantoprazole 40 MG tablet Commonly known as:  PROTONIX Take 1 tablet (40 mg total) by mouth daily.   rosuvastatin 40 MG tablet Commonly known as:  CRESTOR Take 1 tablet (40 mg total) by mouth daily.   spironolactone 25 MG tablet Commonly known as:  ALDACTONE Take 25 mg by mouth daily.        Acute coronary syndrome (MI, NSTEMI, STEMI, etc) this admission?: No.     Outstanding Labs/Studies   FLP/LFTs  Duration of Discharge Encounter   Greater than 30 minutes including physician time.  Signed, Reino Bellis NP-C 12/01/2017, 11:25 AM   Agree with note by Reino Bellis NP-C  Stable for discharge this morning status post distal LAD PCI and drug-eluting stent.  His right radial puncture site is well-healed.  Follow-up with Dr. Percival Burgess as an outpatient.  Lorretta Harp, M.D., [employer], [facility], [place] [address] [place], [place]  [address]  [phone] 12/01/2017 12:52 PM

## 2017-12-01 NOTE — Telephone Encounter (Signed)
New Message   Patient already had an appt scheduled. But Mark Burgess wants this to be treated like a TOC.

## 2017-12-01 NOTE — Telephone Encounter (Signed)
Pt currently admitted.

## 2017-12-02 ENCOUNTER — Telehealth: Payer: Self-pay | Admitting: Cardiology

## 2017-12-02 NOTE — Telephone Encounter (Signed)
Spoke with pharmacist at [facility], she stated pt does not need a prior Auth, called and let pt know and to inform him his Plavix is ready for pick up

## 2017-12-02 NOTE — Telephone Encounter (Signed)
New Message       Pt c/o medication issue:  1. Name of Medication: Plavix   2. How are you currently taking this medication (dosage and times per day)? 1 a day  3. Are you having a reaction (difficulty breathing--STAT)? No   4. What is your medication issue? Needing authorization

## 2017-12-02 NOTE — Telephone Encounter (Signed)
Routed to primary CMA

## 2017-12-02 NOTE — Telephone Encounter (Signed)
Patient contacted regarding discharge from [facility] on 12/01/17.  Patient understands to follow up with provider Dr. Percival Spanish on 12/16/17 at 11:40 am at [facility]. Patient understands his discharge instructions. Patient understands his medications and regimen. Patient understands to bring all of his medications to this visit.  No c/o chest pain, dizziness or sob.

## 2017-12-03 ENCOUNTER — Telehealth (HOSPITAL_COMMUNITY): Payer: Self-pay

## 2017-12-03 NOTE — Telephone Encounter (Signed)
Patients insurance is active and benefits verified through [facility] - $45.00 co-pay, no deductible, out of pocket amount of $4,200/$758.62 has been met, no co-insurance, and no pre-authorization is required. Passport/reference [phone]  Passed referral to RN Navigator for review.

## 2017-12-06 ENCOUNTER — Telehealth (HOSPITAL_COMMUNITY): Payer: Self-pay

## 2017-12-06 NOTE — Telephone Encounter (Signed)
Attempted to contact patient in regards to Cardiac Rehab - lm on vm °

## 2017-12-13 ENCOUNTER — Telehealth (HOSPITAL_COMMUNITY): Payer: Self-pay

## 2017-12-13 NOTE — Telephone Encounter (Signed)
2nd attempt to contact patient in regards to Cardiac Rehab - lm on vm. Sending letter. °

## 2017-12-15 NOTE — Progress Notes (Signed)
Cardiology Office Note   Date:  12/16/2017   ID:  Basheer, Molchan 1941-01-01, MRN [id number]  PCP:  Kirk Ruths, MD  Cardiologist:   Minus Breeding, MD    Chief Complaint  Patient presents with  . Coronary Artery Disease      History of Present Illness: Mark Burgess is a 77 y.o. male who presents for follow-up of mild left ventricular dysfunction and coronary artery disease as described below.    Recently I screened him with a POET (Plain Old Exercise Treadmill).  He had ST depression in stage II.  He had LAD high grade stenosis as reported below and had stenting of this area with overlapping stents.    Since then he says that he has been breathing much better.  He and his wife are walking routinely.  The patient denies any new symptoms such as chest discomfort, neck or arm discomfort. There has been no new shortness of breath, PND or orthopnea. There have been no reported palpitations, presyncope or syncope.   Past Medical History:  Diagnosis Date  . Aortic stenosis    apparent bicuspid aortic valve  . CAD (coronary artery disease)    subtotal occlusion of mid left circumflex, treated successfully w percutaneous coronary intervention using a 3.0- x 16-mm drug eluting stent. Percent stenois was taken from 95-0 w TIMI 2 flow taken to TIMI 3 flow post percutaneous coronary intervention 11/30/17 PCI/DES x2 to the mLAD  . Diabetes mellitus    type II   . 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 40%  . Emphysema lung ([place])   . GERD (gastroesophageal reflux disease)   . History of kidney stones   . HTN (hypertension)    x8 years  . Myocardial infarction [facility])    2012    Past Surgical History:  Procedure Laterality Date  . APPENDECTOMY    . CARDIAC CATHETERIZATION    . COLONOSCOPY WITH PROPOFOL N/A 03/27/2015   Procedure: COLONOSCOPY WITH PROPOFOL;  Surgeon: Manya Silvas, MD;  Location: [facility] ENDOSCOPY;  Service: Endoscopy;   Laterality: N/A;  . CORONARY STENT INTERVENTION N/A 11/30/2017   Procedure: CORONARY STENT INTERVENTION;  Surgeon: Jettie Booze, MD;  Location: [place] CV LAB;  Service: Cardiovascular;  Laterality: N/A;  . CYSTOSCOPY WITH STENT PLACEMENT Right 03/11/2017   Procedure: CYSTOSCOPY WITH STENT PLACEMENT;  Surgeon: Franchot Gallo, MD;  Location: [facility] ORS;  Service: Urology;  Laterality: Right;  . CYSTOSCOPY WITH URETEROSCOPY AND STENT PLACEMENT Right 04/19/2017   Procedure: CYSTOSCOPY WITH URETEROSCOPY AND STENT Removal;  Surgeon: Franchot Gallo, MD;  Location: [facility] ORS;  Service: Urology;  Laterality: Right;  . CYSTOSCOPY/RETROGRADE/URETEROSCOPY/STONE EXTRACTION WITH BASKET Right 03/11/2017   Procedure: CYSTOSCOPY/RETROGRADE/URETEROSCOPY/STONE EXTRACTION WITH BASKET;  Surgeon: Franchot Gallo, MD;  Location: [facility] ORS;  Service: Urology;  Laterality: Right;  . EXTRACORPOREAL SHOCK WAVE LITHOTRIPSY Right 02/25/2017   Procedure: RIGHT EXTRACORPOREAL SHOCK WAVE LITHOTRIPSY (ESWL);  Surgeon: Cleon Gustin, MD;  Location: [facility] ORS;  Service: Urology;  Laterality: Right;  . HOLMIUM LASER APPLICATION Right 40/05/270   Procedure: HOLMIUM LASER APPLICATION;  Surgeon: Franchot Gallo, MD;  Location: [facility] ORS;  Service: Urology;  Laterality: Right;  . IR URETERAL STENT LEFT NEW ACCESS W/O SEP NEPHROSTOMY CATH  07/09/2017  . [place], 2006  . LEFT HEART CATH AND CORONARY ANGIOGRAPHY N/A 11/30/2017   Procedure: LEFT HEART CATH AND CORONARY ANGIOGRAPHY;  Surgeon: Jettie Booze, MD;  Location: [place] CV LAB;  Service: Cardiovascular;  Laterality: N/A;  . NEPHROLITHOTOMY Left 07/12/2017   Procedure: NEPHROLITHOTOMY PERCUTANEOUS;  Surgeon: Franchot Gallo, MD;  Location: [facility]S;  Service: Urology;  Laterality: Left;  . TONSILLECTOMY       Current Outpatient Medications  Medication Sig Dispense Refill  . ADVAIR HFA 115-21 MCG/ACT inhaler Inhale 2 puffs into the lungs every 12  (twelve) hours. (0900 & 2100)    . allopurinol (ZYLOPRIM) 300 MG tablet Take 300 mg by mouth daily.     Marland Kitchen amLODipine (NORVASC) 5 MG tablet Take 5 mg by mouth daily.     Marland Kitchen aspirin EC 81 MG tablet Take 81 mg by mouth daily.    . carvedilol (COREG) 12.5 MG tablet Take 1 tablet (12.5 mg total) by mouth 2 (two) times daily. 60 tablet 5  . clopidogrel (PLAVIX) 75 MG tablet Take 1 tablet (75 mg total) by mouth daily with breakfast. 90 tablet 2  . finasteride (PROSCAR) 5 MG tablet Take 5 mg by mouth daily.     Marland Kitchen latanoprost (XALATAN) 0.005 % ophthalmic solution Place 1 drop into both eyes at bedtime.     Marland Kitchen lisinopril-hydrochlorothiazide (PRINZIDE,ZESTORETIC) 20-12.5 MG per tablet Take 1 tablet by mouth daily.     . metFORMIN (GLUCOPHAGE-XR) 500 MG 24 hr tablet Take 500 mg by mouth 2 (two) times daily.   3  . Multiple Vitamins-Minerals (CENTRUM MEN PO) Take 1 tablet by mouth daily.     . nitroGLYCERIN (NITROSTAT) 0.4 MG SL tablet PLACE 1 TABLET (0.4 MG TOTAL) UNDER THE TONGUE EVERY 5 (FIVE) MINUTES AS NEEDED. 25 tablet 2  . pantoprazole (PROTONIX) 40 MG tablet Take 1 tablet (40 mg total) by mouth daily. 30 tablet 1  . rosuvastatin (CRESTOR) 40 MG tablet Take 1 tablet (40 mg total) by mouth daily. 90 tablet 3  . spironolactone (ALDACTONE) 25 MG tablet Take 25 mg by mouth daily.      No current facility-administered medications for this visit.     Allergies:   Patient has no known allergies.    ROS:  Please see the history of present illness.   Otherwise, review of systems are positive for none.   All other systems are reviewed and negative.    PHYSICAL EXAM: VS:  BP 112/66   Pulse 82   Ht 6\' 2"  (1.88 m)   Wt 213 lb (96.6 kg)   BMI 27.35 kg/m  , BMI Body mass index is 27.35 kg/m.  GENERAL:  Well appearing NECK:  No jugular venous distention, waveform within normal limits, carotid upstroke brisk and symmetric, no bruits, no thyromegaly LUNGS:  Clear to auscultation bilaterally CHEST:   Unremarkable HEART:  PMI not displaced or sustained,S1 and S2 within normal limits, no S3, no S4, no clicks, no rubs, no murmurs ABD:  Flat, positive bowel sounds normal in frequency in pitch, no bruits, no rebound, no guarding, no midline pulsatile mass, no hepatomegaly, no splenomegaly EXT:  2 plus pulses throughout, no edema, no cyanosis no clubbing   EKG:  EKG is not ordered today.    Recent Labs: 11/25/2017: TSH 1.350 12/01/2017: BUN 19; Creatinine, Ser 1.32; Hemoglobin 11.7; Platelets 156; Potassium 3.9; Sodium 139   Lab Results  Component Value Date   HGBA1C 7.1 (H) 07/06/2017    Lipid Panel .lipids  Wt Readings from Last 3 Encounters:  12/16/17 213 lb (96.6 kg)  12/01/17 209 lb 8 oz (95 kg)  11/25/17 213 lb (96.6 kg)  Cardia Cath  11/30/17   Previously placed Mid Cx stent (unknown type) is widely patent.  Ost Cx to Prox Cx lesion is 25% stenosed.  Prox LAD lesion is 25% stenosed.  Mid LAD lesion is 99% stenosed.  Mid LAD to Dist LAD lesion is 70% stenosed.  A drug-eluting stent was successfully placed using a STENT SYNERGY DES 2.5X32.  A drug-eluting stent was successfully placed using a STENT SYNERGY DES 2.75X20, overlapping the first stent.  Post intervention, there is a 0% residual stenosis.  Dist LAD lesion is 75% stenosed. This is too far distal for PCI.   Other studies Reviewed: Additional studies/ records that were reviewed today include: Cardiac cath.   Review of the above records demonstrates:  See elsewhere  ASSESSMENT AND PLAN:  CAD:   He is post PCI.    He has done much better.  No further testing and we will work on continues risk reduciton.  CM:     EF was mildly reduced in the past and I will follow up with echocardiography in the future.   OVERWEIGHT:     We had a long discussion about this at this appt.   DM:  His A1C was 7.1.    I suggested a goal of 6.5.   HTN:   The blood pressure is at target.  No change in therapy.    DYSLIPIDEMIA:  LDL was 91 . I previously changed to Crestor and he will need a lipid profile in one month fasting.   Current medicines are reviewed at length with the patient today.  The patient does not have concerns regarding medicines.  The following changes have been made: None  Labs/ tests ordered today include:    Orders Placed This Encounter  Procedures  . Lipid panel     Disposition:   FU with me in 6 months.   Signed, Minus Breeding, MD  12/16/2017 1:01 PM    [facility]

## 2017-12-16 ENCOUNTER — Ambulatory Visit: Payer: Medicare HMO | Admitting: Cardiology

## 2017-12-16 ENCOUNTER — Encounter: Payer: Self-pay | Admitting: Cardiology

## 2017-12-16 VITALS — BP 112/66 | HR 82 | Ht 74.0 in | Wt 213.0 lb

## 2017-12-16 DIAGNOSIS — I200 Unstable angina: Secondary | ICD-10-CM | POA: Diagnosis not present

## 2017-12-16 DIAGNOSIS — E118 Type 2 diabetes mellitus with unspecified complications: Secondary | ICD-10-CM | POA: Diagnosis not present

## 2017-12-16 DIAGNOSIS — I10 Essential (primary) hypertension: Secondary | ICD-10-CM | POA: Diagnosis not present

## 2017-12-16 DIAGNOSIS — E785 Hyperlipidemia, unspecified: Secondary | ICD-10-CM

## 2017-12-16 NOTE — Patient Instructions (Addendum)
NO CHANGE WITH CURRENT MEDICATIONS   LIPID IN SEPT  2019- NOTHING TO EAT OR DRINK THE MORNING OF THE LABS      Your physician wants you to follow-up in [place]. You will receive a reminder letter in the mail two months in advance. If you don't receive a letter, please call our office to schedule the follow-up appointment.    If you need a refill on your cardiac medications before your next appointment, please call your pharmacy.

## 2017-12-22 ENCOUNTER — Telehealth (HOSPITAL_COMMUNITY): Payer: Self-pay

## 2017-12-22 NOTE — Telephone Encounter (Signed)
Called patient regarding CR, pt stated he is currently exercising at home.  Closed referral

## 2018-01-19 ENCOUNTER — Other Ambulatory Visit: Payer: Self-pay | Admitting: Cardiology

## 2018-01-19 NOTE — Telephone Encounter (Signed)
This is Dr. Hochrein's pt. °

## 2018-01-21 LAB — LIPID PANEL
CHOL/HDL RATIO: 5.2 ratio — AB (ref 0.0–5.0)
Cholesterol, Total: 145 mg/dL (ref 100–199)
HDL: 28 mg/dL — AB (ref >39)
LDL CALC: 54 mg/dL (ref 0–99)
TRIGLYCERIDES: 314 mg/dL — AB (ref 0–149)
VLDL Cholesterol Cal: 63 mg/dL — ABNORMAL HIGH (ref 5–40)

## 2018-02-02 ENCOUNTER — Telehealth: Payer: Self-pay | Admitting: Cardiology

## 2018-02-02 NOTE — Telephone Encounter (Signed)
Returned call to patient lab results given. 

## 2018-02-02 NOTE — Telephone Encounter (Signed)
New message    Patient is returning call about labs.

## 2018-03-29 ENCOUNTER — Telehealth: Payer: Self-pay | Admitting: *Deleted

## 2018-03-29 NOTE — Telephone Encounter (Signed)
   [facility] Pre-operative Risk Assessment    Request for surgical clearance:  1. What type of surgery is being performed? 1 TOOTH EXTRACTION   2. When is this surgery scheduled? TBD   3. What type of clearance is required (medical clearance vs. Pharmacy clearance to hold med vs. Both)? BOTH  4. Are there any medications that need to be held prior to surgery and how long? ANTIBIOTIC PROPHYLAXIS NEEDED?  PLAVIX INSTRUCTIONS?   5. Practice name and name of physician performing surgery? [facility]    6. What is your office phone number? [phone]     7.   What is your office fax number? [phone]   8.   Anesthesia type (None, local, MAC, general) ? ARE THERE ANY RESTRICTIONS

## 2018-03-30 NOTE — Telephone Encounter (Signed)
   Primary Cardiologist: Dr Percival Spanish  Chart reviewed and patient contacted by phone as part of pre-operative protocol coverage. Given past medical history and time since last visit, based on ACC/AHA guidelines, Mark Burgess would be at acceptable risk for the planned procedure without further cardiovascular testing.   We do not recommend holding Plavix for this procedure, especially in light of his recent PCI of July 2019.  He does not need SBE prophylaxis.  I will route this recommendation to the requesting party via Epic fax function and remove from pre-op pool.  Please call with questions.  Kerin Ransom, PA-C 03/30/2018, 4:11 PM

## 2018-04-01 ENCOUNTER — Ambulatory Visit
Admission: RE | Admit: 2018-04-01 | Discharge: 2018-04-01 | Disposition: A | Discharge status: Home / Self Care | Payer: Medicare HMO | Source: Ambulatory Visit | Attending: Specialist | Admitting: Specialist

## 2018-04-01 DIAGNOSIS — R059 Cough, unspecified: Secondary | ICD-10-CM

## 2018-04-01 DIAGNOSIS — R05 Cough: Secondary | ICD-10-CM | POA: Insufficient documentation

## 2018-04-01 DIAGNOSIS — R911 Solitary pulmonary nodule: Secondary | ICD-10-CM | POA: Diagnosis present

## 2018-04-01 MED ORDER — IOPAMIDOL (ISOVUE-300) INJECTION 61%
75.0000 mL | Freq: Once | INTRAVENOUS | Status: AC | PRN
  Administered 2018-04-01: 65 mL via INTRAVENOUS

## 2018-04-13 ENCOUNTER — Encounter: Payer: Self-pay | Admitting: *Deleted

## 2018-04-13 NOTE — Telephone Encounter (Signed)
This encounter was created in error - please disregard.

## 2018-04-13 NOTE — Telephone Encounter (Signed)
Received call from [place] is at dentist today for his tooth extraction and they have not received his clearance.   Advised of pre op recommendations per note and refaxed clearance per request.

## 2018-04-15 ENCOUNTER — Other Ambulatory Visit: Payer: Self-pay | Admitting: Specialist

## 2018-04-15 ENCOUNTER — Other Ambulatory Visit (HOSPITAL_COMMUNITY): Payer: Self-pay | Admitting: Specialist

## 2018-04-15 DIAGNOSIS — R918 Other nonspecific abnormal finding of lung field: Secondary | ICD-10-CM

## 2018-07-19 ENCOUNTER — Ambulatory Visit: Payer: Medicare HMO

## 2018-07-19 ENCOUNTER — Ambulatory Visit
Admission: RE | Admit: 2018-07-19 | Discharge: 2018-07-19 | Disposition: A | Discharge status: Home / Self Care | Payer: Medicare HMO | Source: Ambulatory Visit | Attending: Specialist | Admitting: Specialist

## 2018-07-19 ENCOUNTER — Other Ambulatory Visit: Payer: Self-pay

## 2018-07-19 DIAGNOSIS — R918 Other nonspecific abnormal finding of lung field: Secondary | ICD-10-CM | POA: Diagnosis present

## 2018-08-13 ENCOUNTER — Other Ambulatory Visit: Payer: Self-pay | Admitting: Cardiology

## 2018-10-10 ENCOUNTER — Other Ambulatory Visit: Payer: Self-pay | Admitting: Specialist

## 2018-10-10 DIAGNOSIS — R918 Other nonspecific abnormal finding of lung field: Secondary | ICD-10-CM

## 2018-10-17 ENCOUNTER — Other Ambulatory Visit: Payer: Self-pay

## 2018-10-17 MED ORDER — PANTOPRAZOLE SODIUM 40 MG PO TBEC: 40.0000 mg | DELAYED_RELEASE_TABLET | Freq: Every day | ORAL | 2 refills | Status: DC

## 2018-10-17 NOTE — Telephone Encounter (Signed)
Rx(s) sent to pharmacy electronically.  

## 2018-10-19 ENCOUNTER — Encounter (INDEPENDENT_AMBULATORY_CARE_PROVIDER_SITE_OTHER): Payer: Self-pay

## 2018-10-19 ENCOUNTER — Ambulatory Visit
Admission: RE | Admit: 2018-10-19 | Discharge: 2018-10-19 | Disposition: A | Discharge status: Home / Self Care | Payer: Medicare HMO | Source: Ambulatory Visit | Attending: Specialist | Admitting: Specialist

## 2018-10-19 ENCOUNTER — Other Ambulatory Visit: Payer: Self-pay

## 2018-10-19 DIAGNOSIS — R918 Other nonspecific abnormal finding of lung field: Secondary | ICD-10-CM | POA: Insufficient documentation

## 2018-10-24 ENCOUNTER — Other Ambulatory Visit: Payer: Self-pay | Admitting: Specialist

## 2018-10-24 DIAGNOSIS — R918 Other nonspecific abnormal finding of lung field: Secondary | ICD-10-CM

## 2018-12-20 NOTE — Progress Notes (Signed)
Cardiology Office Note   Date:  12/21/2018   ID:  Saahir, Prude 1941/05/05, MRN [id number]  PCP:  Kirk Ruths, MD  Cardiologist:   Minus Breeding, MD    Chief Complaint  Patient presents with  . Coronary Artery Disease      History of Present Illness: Barry Faircloth is a 78 y.o. male who presents for follow-up of mild left ventricular dysfunction and coronary artery disease as described below.    He had a screening POET (Plain Old Exercise Treadmill).  He had ST depression in stage II.  He had LAD high grade stenosis as reported below and had stenting of this area with overlapping stents.    Since I last saw him he has had increasing fatigue.  He has been more short of breath with activity.  These of been symptoms reminiscent of the problem he felt last year before his angioplasty.  He said he did get better and the symptoms started a few months ago.  He said the other day he was trying to drag some branches up a slight incline when he developed significant fatigue proportion he thought to what he was doing.  He has never really had chest pressure, neck or arm discomfort.  He has not had any new palpitations although he feels like he might have a strong beat in his chest.  He is not having any presyncope or syncope.  He said no weight gain or edema.  In fact he lost a little weight.   Past Medical History:  Diagnosis Date  . Aortic stenosis    apparent bicuspid aortic valve  . CAD (coronary artery disease)    subtotal occlusion of mid left circumflex, treated successfully w percutaneous coronary intervention using a 3.0- x 16-mm drug eluting stent. Percent stenois was taken from 95-0 w TIMI 2 flow taken to TIMI 3 flow post percutaneous coronary intervention 11/30/17 PCI/DES x2 to the mLAD  . Diabetes mellitus    type II   . 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 40%  . Emphysema lung ([place])   . GERD (gastroesophageal reflux disease)    . History of kidney stones   . HTN (hypertension)    x8 years  . Myocardial infarction [facility])    2012    Past Surgical History:  Procedure Laterality Date  . APPENDECTOMY    . CARDIAC CATHETERIZATION    . COLONOSCOPY WITH PROPOFOL N/A 03/27/2015   Procedure: COLONOSCOPY WITH PROPOFOL;  Surgeon: Manya Silvas, MD;  Location: [facility] ENDOSCOPY;  Service: Endoscopy;  Laterality: N/A;  . CORONARY STENT INTERVENTION N/A 11/30/2017   Procedure: CORONARY STENT INTERVENTION;  Surgeon: Jettie Booze, MD;  Location: [place] CV LAB;  Service: Cardiovascular;  Laterality: N/A;  . CYSTOSCOPY WITH STENT PLACEMENT Right 03/11/2017   Procedure: CYSTOSCOPY WITH STENT PLACEMENT;  Surgeon: Franchot Gallo, MD;  Location: [facility]S;  Service: Urology;  Laterality: Right;  . CYSTOSCOPY WITH URETEROSCOPY AND STENT PLACEMENT Right 04/19/2017   Procedure: CYSTOSCOPY WITH URETEROSCOPY AND STENT Removal;  Surgeon: Franchot Gallo, MD;  Location: [facility] ORS;  Service: Urology;  Laterality: Right;  . CYSTOSCOPY/RETROGRADE/URETEROSCOPY/STONE EXTRACTION WITH BASKET Right 03/11/2017   Procedure: CYSTOSCOPY/RETROGRADE/URETEROSCOPY/STONE EXTRACTION WITH BASKET;  Surgeon: Franchot Gallo, MD;  [facility]: [facility]S;  Service: Urology;  Laterality: Right;  . EXTRACORPOREAL SHOCK WAVE LITHOTRIPSY Right 02/25/2017   Procedure: RIGHT EXTRACORPOREAL SHOCK WAVE LITHOTRIPSY (ESWL);  Surgeon: Cleon Gustin, MD;  Location:  [facility] ORS;  Service: Urology;  Laterality: Right;  . HOLMIUM LASER APPLICATION Right 40/01/8118   Procedure: HOLMIUM LASER APPLICATION;  Surgeon: Franchot Gallo, MD;  [facility];  Service: Urology;  Laterality: Right;  . IR URETERAL STENT LEFT NEW ACCESS W/O SEP NEPHROSTOMY CATH  07/09/2017  . [place], 2006  . LEFT HEART CATH AND CORONARY ANGIOGRAPHY N/A 11/30/2017   Procedure: LEFT HEART CATH AND CORONARY ANGIOGRAPHY;  Surgeon: Jettie Booze, MD;  Location: [place] CV LAB;   Service: Cardiovascular;  Laterality: N/A;  . NEPHROLITHOTOMY Left 07/12/2017   Procedure: NEPHROLITHOTOMY PERCUTANEOUS;  Surgeon: Franchot Gallo, MD;  [facility];  Service: Urology;  Laterality: Left;  . TONSILLECTOMY       Current Outpatient Medications  Medication Sig Dispense Refill  . ADVAIR HFA 115-21 MCG/ACT inhaler Inhale 2 puffs into the lungs every 12 (twelve) hours. (0900 & 2100)    . allopurinol (ZYLOPRIM) 300 MG tablet Take 300 mg by mouth daily.     Marland Kitchen amLODipine (NORVASC) 2.5 MG tablet Take 1 tablet (2.5 mg total) by mouth daily. 90 tablet 3  . aspirin EC 81 MG tablet Take 81 mg by mouth daily.    . carvedilol (COREG) 12.5 MG tablet Take 1 tablet (12.5 mg total) by mouth 2 (two) times daily. 60 tablet 5  . clopidogrel (PLAVIX) 75 MG tablet TAKE 1 TABLET (75 MG TOTAL) BY MOUTH DAILY WITH BREAKFAST. 90 tablet 2  . finasteride (PROSCAR) 5 MG tablet Take 5 mg by mouth daily.     Marland Kitchen latanoprost (XALATAN) 0.005 % ophthalmic solution Place 1 drop into both eyes at bedtime.     Marland Kitchen lisinopril-hydrochlorothiazide (PRINZIDE,ZESTORETIC) 20-12.5 MG per tablet Take 1 tablet by mouth daily.     . metFORMIN (GLUCOPHAGE-XR) 500 MG 24 hr tablet Take 500 mg by mouth 2 (two) times daily.   3  . Multiple Vitamins-Minerals (CENTRUM MEN PO) Take 1 tablet by mouth daily.     . nitroGLYCERIN (NITROSTAT) 0.4 MG SL tablet PLACE 1 TABLET (0.4 MG TOTAL) UNDER THE TONGUE EVERY 5 (FIVE) MINUTES AS NEEDED. 25 tablet 2  . pantoprazole (PROTONIX) 40 MG tablet Take 1 tablet (40 mg total) by mouth daily. 30 tablet 2  . spironolactone (ALDACTONE) 25 MG tablet Take 25 mg by mouth daily.     . rosuvastatin (CRESTOR) 40 MG tablet Take 1 tablet (40 mg total) by mouth daily. 90 tablet 3   No current facility-administered medications for this visit.     Allergies:   Patient has no known allergies.    ROS:  Please see the history of present illness.   Otherwise, review of systems are positive for none.   All  other systems are reviewed and negative.    PHYSICAL EXAM: VS:  BP 110/70   Pulse 73   Temp 98.4 F (36.9 C) (Temporal)   Ht 6\' 2"  (1.88 m)   Wt 201 lb 3.2 oz (91.3 kg)   SpO2 94%   BMI 25.83 kg/m  , BMI Body mass index is 25.83 kg/m.  GENERAL:  Well appearing NECK:  No jugular venous distention, waveform within normal limits, carotid upstroke brisk and symmetric, no bruits, no thyromegaly LUNGS:  Clear to auscultation bilaterally CHEST:  Unremarkable HEART:  PMI not displaced or sustained,S1 and S2 within normal limits, no S3, no S4, no clicks, no rubs, no murmurs ABD:  Flat, positive bowel sounds normal in frequency in pitch, no bruits, no rebound, no  guarding, no midline pulsatile mass, no hepatomegaly, no splenomegaly EXT:  2 plus pulses throughout, no edema, no cyanosis no clubbing   EKG:  EKG is  ordered today. Sinus rhythm, rate 73, axis left axis deviation, poor anterior R wave progression, no acute ST-T wave changes.   Recent Labs: No results found for requested labs within last 8760 hours.   Lab Results  Component Value Date   HGBA1C 7.1 (H) 07/06/2017    Lipid Panel Lab Results  Component Value Date   CHOL 145 01/21/2018   TRIG 314 (H) 01/21/2018   HDL 28 (L) 01/21/2018   LDLCALC 54 01/21/2018   Lab Results  Component Value Date   HGBA1C 7.1 (H) 07/06/2017     Wt Readings from Last 3 Encounters:  12/21/18 201 lb 3.2 oz (91.3 kg)  12/16/17 213 lb (96.6 kg)  12/01/17 209 lb 8 oz (95 kg)    Cardia Cath  11/30/17   Previously placed Mid Cx stent (unknown type) is widely patent.  Ost Cx to Prox Cx lesion is 25% stenosed.  Prox LAD lesion is 25% stenosed.  Mid LAD lesion is 99% stenosed.  Mid LAD to Dist LAD lesion is 70% stenosed.  A drug-eluting stent was successfully placed using a STENT SYNERGY DES 2.5X32.  A drug-eluting stent was successfully placed using a STENT SYNERGY DES 2.75X20, overlapping the first stent.  Post intervention,  there is a 0% residual stenosis.  Dist LAD lesion is 75% stenosed. This is too far distal for PCI.   Other studies Reviewed: Additional studies/ records that were reviewed today include: Cardiac cath.   Review of the above records demonstrates:  See elsewhere  ASSESSMENT AND PLAN:  CAD:   He is post PCI.   Given the fact that he has recurrent symptoms similar to what he had last year and will screen him with a stress perfusion study.  Further testing will be based on this result.   CM:     EF was mildly reduced in the past.  At this point I am not planning echocardiography but will follow up with the EF on his perfusion study.   OVERWEIGHT:   He understands the need for weight loss with diet and exercise.  DM:  His A1C was 7.1.  This is up slightly from previous.  I will defer to Dr. Ouida Sills.   HTN:   The blood pressure is on the low side and he is fatigued.  Emina reduce his amlodipine to 2-1/2 mg daily.   DYSLIPIDEMIA:  LDL was 54 .  This is much improved.  No change in therapy.  Current medicines are reviewed at length with the patient today.  The patient does not have concerns regarding medicines.  The following changes have been made: As above  Labs/ tests ordered today include:    Orders Placed This Encounter  Procedures  . MYOCARDIAL PERFUSION IMAGING     Disposition:   FU with me in 6 months.   Signed, Minus Breeding, MD  12/21/2018 4:21 PM    [place]

## 2018-12-21 ENCOUNTER — Encounter: Payer: Self-pay | Admitting: Cardiology

## 2018-12-21 ENCOUNTER — Other Ambulatory Visit: Payer: Self-pay

## 2018-12-21 ENCOUNTER — Ambulatory Visit: Payer: Medicare HMO | Admitting: Cardiology

## 2018-12-21 VITALS — BP 110/70 | HR 73 | Temp 98.4°F | Ht 74.0 in | Wt 201.2 lb

## 2018-12-21 DIAGNOSIS — R5383 Other fatigue: Secondary | ICD-10-CM | POA: Insufficient documentation

## 2018-12-21 DIAGNOSIS — R0609 Other forms of dyspnea: Secondary | ICD-10-CM | POA: Insufficient documentation

## 2018-12-21 DIAGNOSIS — I2510 Atherosclerotic heart disease of native coronary artery without angina pectoris: Secondary | ICD-10-CM

## 2018-12-21 DIAGNOSIS — E118 Type 2 diabetes mellitus with unspecified complications: Secondary | ICD-10-CM | POA: Diagnosis not present

## 2018-12-21 MED ORDER — AMLODIPINE BESYLATE 2.5 MG PO TABS: 2.5000 mg | ORAL_TABLET | Freq: Every day | ORAL | 3 refills | Status: AC

## 2018-12-21 NOTE — Patient Instructions (Addendum)
Medication Instructions:  DECREASE YOUR AMLODIPINE TO 2.5 MG DAILY   If you need a refill on your cardiac medications before your next appointment, please call your pharmacy.   Lab work: NONE  Testing/Procedures: Your physician has requested that you have a lexiscan myoview. For further information please visit [email or web address]. Please follow instruction sheet, as given.  Follow-Up: At [facility], you and your health needs are our priority.  As part of our continuing mission to provide you with exceptional heart care, we have created designated Provider Care Teams.  These Care Teams include your primary Cardiologist (physician) and Advanced Practice Providers (APPs -  Physician Assistants and Nurse Practitioners) who all work together to provide you with the care you need, when you need it. You will need a follow up appointment in 6 months.  Please call our office 2 months in advance to schedule this appointment.  You may see Minus Breeding, MD or one of the following Advanced Practice Providers on your designated Care Team:   Rosaria Ferries, PA-C Jory Sims, DNP, ANP FOLLOW UP BASED ON YOUR LEXISCAN BEING OK  Any Other Special Instructions Will Be Listed Below (If Applicable).  Cardiac Nuclear Scan A cardiac nuclear scan is a test that is done to check the flow of blood to your heart. It is done when you are resting and when you are exercising. The test looks for problems such as:  Not enough blood reaching a portion of the heart.  The heart muscle not working as it should. You may need this test if:  You have heart disease.  You have had lab results that are not normal.  You have had heart surgery or a balloon procedure to open up blocked arteries (angioplasty).  You have chest pain.  You have shortness of breath. In this test, a special dye (tracer) is put into your bloodstream. The tracer will travel to your heart. A camera will then take pictures of your heart  to see how the tracer moves through your heart. This test is usually done at a hospital and takes 2-4 hours. Tell a doctor about:  Any allergies you have.  All medicines you are taking, including vitamins, herbs, eye drops, creams, and over-the-counter medicines.  Any problems you or family members have had with anesthetic medicines.  Any blood disorders you have.  Any surgeries you have had.  Any medical conditions you have.  Whether you are pregnant or may be pregnant. What are the risks? Generally, this is a safe test. However, problems may occur, such as:  Serious chest pain and heart attack. This is only a risk if the stress portion of the test is done.  Rapid heartbeat.  A feeling of warmth in your chest. This feeling usually does not last long.  Allergic reaction to the tracer. What happens before the test?  Ask your doctor about changing or stopping your normal medicines. This is important.  Follow instructions from your doctor about what you cannot eat or drink.  Remove your jewelry on the day of the test. What happens during the test?  An IV tube will be inserted into one of your veins.  Your doctor will give you a small amount of tracer through the IV tube.  You will wait for 20-40 minutes while the tracer moves through your bloodstream.  Your heart will be monitored with an electrocardiogram (ECG).  You will lie down on an exam table.  Pictures of your heart will be taken for about  15-20 minutes.  You may also have a stress test. For this test, one of these things may be done: ? You will be asked to exercise on a treadmill or a stationary bike. ? You will be given medicines that will make your heart work harder. This is done if you are unable to exercise.  When blood flow to your heart has peaked, a tracer will again be given through the IV tube.  After 20-40 minutes, you will get back on the exam table. More pictures will be taken of your heart.   Depending on the tracer that is used, more pictures may need to be taken 3-4 hours later.  Your IV tube will be removed when the test is over. The test may vary among doctors and hospitals. What happens after the test?  Ask your doctor: ? Whether you can return to your normal schedule, including diet, activities, and medicines. ? Whether you should drink more fluids. This will help to remove the tracer from your body. Drink enough fluid to keep your pee (urine) pale yellow.  Ask your doctor, or the department that is doing the test: ? When will my results be ready? ? How will I get my results? Summary  A cardiac nuclear scan is a test that is done to check the flow of blood to your heart.  Tell your doctor whether you are pregnant or may be pregnant.  Before the test, ask your doctor about changing or stopping your normal medicines. This is important.  Ask your doctor whether you can return to your normal activities. You may be asked to drink more fluids. This information is not intended to replace advice given to you by your health care provider. Make sure you discuss any questions you have with your health care provider. Document Released: 10/11/2017 Document Revised: 08/17/2018 Document Reviewed: 10/11/2017 Elsevier Patient Education  2020 [employer].

## 2018-12-21 NOTE — Addendum Note (Signed)
Addended by: Alvina Filbert B on: 12/21/2018 06:17 PM   Modules accepted: Orders

## 2018-12-22 ENCOUNTER — Telehealth (HOSPITAL_COMMUNITY): Payer: Self-pay | Admitting: *Deleted

## 2018-12-22 NOTE — Telephone Encounter (Signed)
Close encounter 

## 2018-12-23 ENCOUNTER — Other Ambulatory Visit: Payer: Self-pay

## 2018-12-23 ENCOUNTER — Ambulatory Visit (HOSPITAL_COMMUNITY)
Admission: RE | Admit: 2018-12-23 | Discharge: 2018-12-23 | Disposition: A | Discharge status: Home / Self Care | Payer: Medicare HMO | Source: Ambulatory Visit | Attending: Cardiology | Admitting: Cardiology

## 2018-12-23 DIAGNOSIS — R0609 Other forms of dyspnea: Secondary | ICD-10-CM | POA: Diagnosis not present

## 2018-12-23 DIAGNOSIS — I2510 Atherosclerotic heart disease of native coronary artery without angina pectoris: Secondary | ICD-10-CM | POA: Diagnosis not present

## 2018-12-23 DIAGNOSIS — R5383 Other fatigue: Secondary | ICD-10-CM | POA: Insufficient documentation

## 2018-12-23 LAB — MYOCARDIAL PERFUSION IMAGING
LV dias vol: 149 mL (ref 62–150)
LV sys vol: 92 mL
Peak HR: 87 {beats}/min
Rest HR: 63 {beats}/min
SDS: 7
SRS: 6
SSS: 13
TID: 1.2

## 2018-12-23 MED ORDER — TECHNETIUM TC 99M TETROFOSMIN IV KIT
10.1000 | PACK | Freq: Once | INTRAVENOUS | Status: AC | PRN
  Administered 2018-12-23: 10.1 via INTRAVENOUS
  Filled 2018-12-23: qty 11

## 2018-12-23 MED ORDER — TECHNETIUM TC 99M TETROFOSMIN IV KIT
29.9000 | PACK | Freq: Once | INTRAVENOUS | Status: AC | PRN
  Administered 2018-12-23: 29.9 via INTRAVENOUS
  Filled 2018-12-23: qty 30

## 2018-12-23 MED ORDER — REGADENOSON 0.4 MG/5ML IV SOLN
0.4000 mg | Freq: Once | INTRAVENOUS | Status: AC
  Administered 2018-12-23: 0.4 mg via INTRAVENOUS

## 2019-01-10 DIAGNOSIS — E663 Overweight: Secondary | ICD-10-CM | POA: Insufficient documentation

## 2019-01-10 DIAGNOSIS — I255 Ischemic cardiomyopathy: Secondary | ICD-10-CM | POA: Insufficient documentation

## 2019-01-10 DIAGNOSIS — I25119 Atherosclerotic heart disease of native coronary artery with unspecified angina pectoris: Secondary | ICD-10-CM | POA: Insufficient documentation

## 2019-01-10 NOTE — Progress Notes (Signed)
Cardiology Office Note   Date:  01/12/2019   ID:  [name], [name] [birth year], MRN [id number]  PCP:  Kirk Ruths, MD  Cardiologist:   Minus Breeding, MD    Chief Complaint  Patient presents with  . Coronary Artery Disease      History of Present Illness: Mark Burgess is a 79 y.o. male who presents for follow-up of mild left ventricular dysfunction and coronary artery disease as described below.    He had a screening POET (Plain Old Exercise Treadmill).  He had ST depression in stage II.  He had LAD high grade stenosis as reported below and had stenting of this area with overlapping stents.  He was having dyspnea when I last saw him.  I sent him for a perfusion study with an EF of 38% with prior inferior infarct with peri infarct ischemia.    At the time when I saw him and did this study he was complaining of a little dyspnea.  He was having some dizziness.  However, he says he feels much better now.  He denies any chest pressure, neck or arm discomfort.  He has no new shortness of breath, PND or orthopnea.  He has no weight gain or edema.  He is very active taking care of several yards.  Past Medical History:  Diagnosis Date  . Aortic stenosis    apparent bicuspid aortic valve  . CAD (coronary artery disease)    subtotal occlusion of mid left circumflex, treated successfully w percutaneous coronary intervention using a 3.0- x 16-mm drug eluting stent. Percent stenois was taken from 95-0 w TIMI 2 flow taken to TIMI 3 flow post percutaneous coronary intervention 11/30/17 PCI/DES x2 to the mLAD  . Diabetes mellitus    type II   . 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 40%  . Emphysema lung ([place])   . GERD (gastroesophageal reflux disease)   . History of kidney stones   . HTN (hypertension)    x8 years  . Myocardial infarction [facility])    2012    Past Surgical History:  Procedure Laterality Date  . APPENDECTOMY    . CARDIAC  CATHETERIZATION    . COLONOSCOPY WITH PROPOFOL N/A 03/27/2015   Procedure: COLONOSCOPY WITH PROPOFOL;  Surgeon: Manya Silvas, MD;  Location: [facility] ENDOSCOPY;  Service: Endoscopy;  Laterality: N/A;  . CORONARY STENT INTERVENTION N/A 11/30/2017   Procedure: CORONARY STENT INTERVENTION;  Surgeon: Jettie Booze, MD;  Location: [place] CV LAB;  Service: Cardiovascular;  Laterality: N/A;  . CYSTOSCOPY WITH STENT PLACEMENT Right 03/11/2017   Procedure: CYSTOSCOPY WITH STENT PLACEMENT;  Surgeon: Franchot Gallo, MD;  Location: [facility] ORS;  Service: Urology;  Laterality: Right;  . CYSTOSCOPY WITH URETEROSCOPY AND STENT PLACEMENT Right 04/19/2017   Procedure: CYSTOSCOPY WITH URETEROSCOPY AND STENT Removal;  Surgeon: Franchot Gallo, MD;  Location: [facility] ORS;  Service: Urology;  Laterality: Right;  . CYSTOSCOPY/RETROGRADE/URETEROSCOPY/STONE EXTRACTION WITH BASKET Right 03/11/2017   Procedure: CYSTOSCOPY/RETROGRADE/URETEROSCOPY/STONE EXTRACTION WITH BASKET;  Surgeon: Franchot Gallo, MD;  Location: [facility] ORS;  Service: Urology;  Laterality: Right;  . EXTRACORPOREAL SHOCK WAVE LITHOTRIPSY Right 02/25/2017   Procedure: RIGHT EXTRACORPOREAL SHOCK WAVE LITHOTRIPSY (ESWL);  Surgeon: Cleon Gustin, MD;  Location: [facility] ORS;  Service: Urology;  Laterality: Right;  . HOLMIUM LASER APPLICATION Right [id number]   Procedure: HOLMIUM LASER APPLICATION;  Surgeon: Franchot Gallo, MD;  Location: [facility] ORS;  Service: Urology;  Laterality: Right;  .  IR URETERAL STENT LEFT NEW ACCESS W/O SEP NEPHROSTOMY CATH  07/09/2017  . [place], 2006  . LEFT HEART CATH AND CORONARY ANGIOGRAPHY N/A 11/30/2017   Procedure: LEFT HEART CATH AND CORONARY ANGIOGRAPHY;  Surgeon: Jettie Booze, MD;  Location: [place] CV LAB;  Service: Cardiovascular;  Laterality: N/A;  . NEPHROLITHOTOMY Left 07/12/2017   Procedure: NEPHROLITHOTOMY PERCUTANEOUS;  Surgeon: Franchot Gallo, MD;  Location: [facility] ORS;  Service: Urology;   Laterality: Left;  . TONSILLECTOMY       Current Outpatient Medications  Medication Sig Dispense Refill  . ADVAIR HFA 115-21 MCG/ACT inhaler Inhale 2 puffs into the lungs every 12 (twelve) hours. (0900 & 2100)    . allopurinol (ZYLOPRIM) 300 MG tablet Take 300 mg by mouth daily.     Marland Kitchen amLODipine (NORVASC) 2.5 MG tablet Take 1 tablet (2.5 mg total) by mouth daily. 90 tablet 3  . aspirin EC 81 MG tablet Take 81 mg by mouth daily.    . carvedilol (COREG) 12.5 MG tablet Take 1 tablet (12.5 mg total) by mouth 2 (two) times daily. 60 tablet 5  . clopidogrel (PLAVIX) 75 MG tablet TAKE 1 TABLET (75 MG TOTAL) BY MOUTH DAILY WITH BREAKFAST. 90 tablet 2  . finasteride (PROSCAR) 5 MG tablet Take 5 mg by mouth daily.     Marland Kitchen latanoprost (XALATAN) 0.005 % ophthalmic solution Place 1 drop into both eyes at bedtime.     Marland Kitchen lisinopril-hydrochlorothiazide (PRINZIDE,ZESTORETIC) 20-12.5 MG per tablet Take 1 tablet by mouth daily.     . metFORMIN (GLUCOPHAGE-XR) 500 MG 24 hr tablet Take 500 mg by mouth 2 (two) times daily.   3  . Multiple Vitamins-Minerals (CENTRUM MEN PO) Take 1 tablet by mouth daily.     . nitroGLYCERIN (NITROSTAT) 0.4 MG SL tablet PLACE 1 TABLET (0.4 MG TOTAL) UNDER THE TONGUE EVERY 5 (FIVE) MINUTES AS NEEDED. 25 tablet 2  . pantoprazole (PROTONIX) 40 MG tablet Take 1 tablet (40 mg total) by mouth daily. 30 tablet 2  . spironolactone (ALDACTONE) 25 MG tablet Take 25 mg by mouth daily.     . rosuvastatin (CRESTOR) 40 MG tablet Take 1 tablet (40 mg total) by mouth daily. 90 tablet 3   No current facility-administered medications for this visit.     Allergies:   Patient has no known allergies.    ROS:  Please see the history of present illness.   Otherwise, review of systems are positive for none.   All other systems are reviewed and negative.    PHYSICAL EXAM: VS:  BP (!) 110/54 (BP Location: Left Arm, Patient Position: Sitting, Cuff Size: Normal)   Pulse 65   Temp (!) 97.3 F (36.3 C)    Ht 6\' 2"  (1.88 m)   Wt 199 lb (90.3 kg)   BMI 25.55 kg/m  , BMI Body mass index is 25.55 kg/m.  GENERAL:  Well appearing NECK:  No jugular venous distention, waveform within normal limits, carotid upstroke brisk and symmetric, no bruits, no thyromegaly LUNGS:  Clear to auscultation bilaterally CHEST:  Unremarkable HEART:  PMI not displaced or sustained,S1 and S2 within normal limits, no S3, no S4, no clicks, no rubs, no murmurs ABD:  Flat, positive bowel sounds normal in frequency in pitch, no bruits, no rebound, no guarding, no midline pulsatile mass, no hepatomegaly, no splenomegaly, umbilical hernia EXT:  2 plus pulses throughout, no edema, no cyanosis no clubbing   EKG:  EKG is not ordered today.  Recent Labs: No results found for requested labs within last 8760 hours.   Lab Results  Component Value Date   HGBA1C 7.1 (H) 07/06/2017    Lipid Panel Lab Results  Component Value Date   CHOL 145 01/21/2018   TRIG 314 (H) 01/21/2018   HDL 28 (L) 01/21/2018   LDLCALC 54 01/21/2018   Lab Results  Component Value Date   HGBA1C 7.1 (H) 07/06/2017     Wt Readings from Last 3 Encounters:  01/12/19 199 lb (90.3 kg)  12/23/18 201 lb (91.2 kg)  12/21/18 201 lb 3.2 oz (91.3 kg)    Cardia Cath  11/30/17   Previously placed Mid Cx stent (unknown type) is widely patent.  Ost Cx to Prox Cx lesion is 25% stenosed.  Prox LAD lesion is 25% stenosed.  Mid LAD lesion is 99% stenosed.  Mid LAD to Dist LAD lesion is 70% stenosed.  A drug-eluting stent was successfully placed using a STENT SYNERGY DES 2.5X32.  A drug-eluting stent was successfully placed using a STENT SYNERGY DES 2.75X20, overlapping the first stent.  Post intervention, there is a 0% residual stenosis.  Dist LAD lesion is 75% stenosed. This is too far distal for PCI.   Other studies Reviewed: Additional studies/ records that were reviewed today include: Cath 2012 and 19 and Lexiscan Myoview.   Review of  the above records demonstrates:  See elsewhere  ASSESSMENT AND PLAN:  CAD:  He had a stress perfusion study with results as above.  I reviewed extensively.  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  That is why his stress perfusion study is abnormal as above.  There was no ischemia in the LAD distribution though and is no longer having symptoms so I will continue to manage him medically.  I am going to include continuing Plavix and aspirin intentionally because of his significant disease including distal LAD stenosis that was managed medically.  CM:     EF was mildly reduced in the past.  He is not having any new symptoms.  Continue meds as listed.  His EF is about 40%.   OVERWEIGHT: We talked about this in the past.  He has been given specific instructions.   DM:  His A1C was down to 6.3 from 7.1 in July.  We will continue meds as listed.   HTN:   The blood pressure is on the low side.  He does not tolerate further med titration.  I reduced his amlodipine at the last visit but I would like to try to continue it for now.  On the low side and he is fatigued.   DYSLIPIDEMIA:  LDL was 52.  Continue current therapy.  Current medicines are reviewed at length with the patient today.  The patient does not have concerns regarding medicines.  The following changes have been made: None  Labs/ tests ordered today include:  None  No orders of the defined types were placed in this encounter.    Disposition:   FU with me in 12 months.   Signed, Minus Breeding, MD  01/12/2019 2:39 PM    [facility]

## 2019-01-12 ENCOUNTER — Ambulatory Visit: Payer: Medicare HMO | Admitting: Cardiology

## 2019-01-12 ENCOUNTER — Other Ambulatory Visit: Payer: Self-pay | Admitting: Cardiology

## 2019-01-12 ENCOUNTER — Other Ambulatory Visit: Payer: Self-pay

## 2019-01-12 ENCOUNTER — Encounter: Payer: Self-pay | Admitting: Cardiology

## 2019-01-12 VITALS — BP 110/54 | HR 65 | Temp 97.3°F | Ht 74.0 in | Wt 199.0 lb

## 2019-01-12 DIAGNOSIS — I25119 Atherosclerotic heart disease of native coronary artery with unspecified angina pectoris: Secondary | ICD-10-CM

## 2019-01-12 DIAGNOSIS — E118 Type 2 diabetes mellitus with unspecified complications: Secondary | ICD-10-CM

## 2019-01-12 DIAGNOSIS — E785 Hyperlipidemia, unspecified: Secondary | ICD-10-CM | POA: Diagnosis not present

## 2019-01-12 DIAGNOSIS — I255 Ischemic cardiomyopathy: Secondary | ICD-10-CM | POA: Diagnosis not present

## 2019-01-12 DIAGNOSIS — E663 Overweight: Secondary | ICD-10-CM

## 2019-01-12 NOTE — Patient Instructions (Signed)
Medication Instructions:  NO CHANGE If you need a refill on your cardiac medications before your next appointment, please call your pharmacy.   Lab work: If you have labs (blood work) drawn today and your tests are completely normal, you will receive your results only by: Marland Kitchen MyChart Message (if you have MyChart) OR . A paper copy in the mail If you have any lab test that is abnormal or we need to change your treatment, we will call you to review the results.  Follow-Up: At [facility], you and your health needs are our priority.  As part of our continuing mission to provide you with exceptional heart care, we have created designated Provider Care Teams.  These Care Teams include your primary Cardiologist (physician) and Advanced Practice Providers (APPs -  Physician Assistants and Nurse Practitioners) who all work together to provide you with the care you need, when you need it. You will need a follow up appointment in 12 months.  Please call our office 2 months in advance to schedule this appointment.  You may see Minus Breeding, MD or one of the following Advanced Practice Providers on your designated Care Team:   Rosaria Ferries, PA-C . Jory Sims, DNP, ANP

## 2019-04-10 ENCOUNTER — Encounter (INDEPENDENT_AMBULATORY_CARE_PROVIDER_SITE_OTHER): Payer: Self-pay

## 2019-04-10 ENCOUNTER — Ambulatory Visit
Admission: RE | Admit: 2019-04-10 | Discharge: 2019-04-10 | Disposition: A | Discharge status: Home / Self Care | Payer: Medicare HMO | Source: Ambulatory Visit | Attending: Specialist | Admitting: Specialist

## 2019-04-10 ENCOUNTER — Other Ambulatory Visit: Payer: Self-pay

## 2019-04-10 DIAGNOSIS — R918 Other nonspecific abnormal finding of lung field: Secondary | ICD-10-CM | POA: Diagnosis present

## 2019-04-17 ENCOUNTER — Other Ambulatory Visit: Payer: Self-pay | Admitting: Specialist

## 2019-04-17 DIAGNOSIS — R918 Other nonspecific abnormal finding of lung field: Secondary | ICD-10-CM

## 2019-04-26 ENCOUNTER — Ambulatory Visit (HOSPITAL_COMMUNITY)
Admission: RE | Admit: 2019-04-26 | Discharge: 2019-04-26 | Disposition: A | Discharge status: Home / Self Care | Payer: Medicare HMO | Source: Ambulatory Visit | Attending: Specialist | Admitting: Specialist

## 2019-04-26 ENCOUNTER — Other Ambulatory Visit: Payer: Self-pay

## 2019-04-26 ENCOUNTER — Other Ambulatory Visit: Payer: Self-pay | Admitting: Cardiology

## 2019-04-26 DIAGNOSIS — I2510 Atherosclerotic heart disease of native coronary artery without angina pectoris: Secondary | ICD-10-CM | POA: Insufficient documentation

## 2019-04-26 DIAGNOSIS — Z87891 Personal history of nicotine dependence: Secondary | ICD-10-CM | POA: Insufficient documentation

## 2019-04-26 DIAGNOSIS — J439 Emphysema, unspecified: Secondary | ICD-10-CM | POA: Insufficient documentation

## 2019-04-26 DIAGNOSIS — Z79899 Other long term (current) drug therapy: Secondary | ICD-10-CM | POA: Diagnosis not present

## 2019-04-26 DIAGNOSIS — Z7982 Long term (current) use of aspirin: Secondary | ICD-10-CM | POA: Diagnosis not present

## 2019-04-26 DIAGNOSIS — R918 Other nonspecific abnormal finding of lung field: Secondary | ICD-10-CM | POA: Diagnosis not present

## 2019-04-26 DIAGNOSIS — I700 Atherosclerosis of aorta: Secondary | ICD-10-CM | POA: Diagnosis not present

## 2019-04-26 LAB — GLUCOSE, CAPILLARY: Glucose-Capillary: 124 mg/dL — ABNORMAL HIGH (ref 70–99)

## 2019-04-26 MED ORDER — FLUDEOXYGLUCOSE F - 18 (FDG) INJECTION
9.8200 | Freq: Once | INTRAVENOUS | Status: AC
  Administered 2019-04-26: 9.82 via INTRAVENOUS

## 2019-05-30 ENCOUNTER — Other Ambulatory Visit: Payer: Self-pay | Admitting: Urology

## 2019-05-30 ENCOUNTER — Telehealth: Payer: Self-pay | Admitting: Cardiology

## 2019-05-30 NOTE — Telephone Encounter (Signed)
New message      [facility] Pre-operative Risk Assessment    Request for surgical clearance:  1. What type of surgery is being performed? Right Laparoscopic Nephrectomy  2. When is this surgery scheduled? 07/07/19  3. What type of clearance is required (medical clearance vs. Pharmacy clearance to hold med vs. Both)?both  4. Are there any medications that need to be held prior to surgery and how long?Plavix   5. Practice name and name of physician performing surgery?[facility], Dr. Link Snuffer  6. What is your office phone number [phone] ext [phone]    7.   What is your office fax number [phone]  8.   Anesthesia type (None, local, MAC, general) ? General    Mark Burgess 05/30/2019, 11:44 AM  _________________________________________________________________   (provider comments below)

## 2019-05-31 ENCOUNTER — Telehealth: Payer: Self-pay | Admitting: Oncology

## 2019-05-31 NOTE — Telephone Encounter (Signed)
Received a new pt referral from Dr. Gloriann Loan for rt uncertain neoplasm of kidney. Mark Burgess has been cld and scheduled to see Mark Burgess on 1/26 at 11am. Pt aware to arrive 15 minutes early.

## 2019-06-04 NOTE — Telephone Encounter (Signed)
He would be OK to hold Plavix before the procedure.  Restart afterward please.

## 2019-06-05 NOTE — Telephone Encounter (Signed)
   Primary Cardiologist: Minus Breeding, MD  Chart reviewed as part of pre-operative protocol coverage. Patient was contacted 06/05/2019 in reference to pre-operative risk assessment for pending surgery as outlined below.  Eulice Stranz was last seen on 01/12/2019 by Dr. Lolly Mustache.  Since that day, Mark Burgess has done well. He has stable CAD and mildly reduced EF of 40%. He is having no anginal or heart failure symptoms. He does yardwork and walks in his neighborhood several times per day without any exertional symptoms. 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   Therefore, based on ACC/AHA guidelines, the patient would be at acceptable risk for the planned procedure without further cardiovascular testing.   He will be OK to hold Plavix before the procedure. Restart afterward please.   I will route this recommendation to the requesting party via Epic fax function and remove from pre-op pool.  Please call with questions.  Daune Perch, NP 06/05/2019, 10:16 AM

## 2019-06-06 ENCOUNTER — Other Ambulatory Visit: Payer: Self-pay

## 2019-06-06 ENCOUNTER — Inpatient Hospital Stay: Payer: Medicare HMO | Attending: Oncology | Admitting: Oncology

## 2019-06-06 VITALS — BP 157/69 | HR 73 | Temp 97.5°F | Resp 17 | Ht 74.0 in | Wt 206.7 lb

## 2019-06-06 DIAGNOSIS — N2889 Other specified disorders of kidney and ureter: Secondary | ICD-10-CM | POA: Diagnosis not present

## 2019-06-06 DIAGNOSIS — R918 Other nonspecific abnormal finding of lung field: Secondary | ICD-10-CM

## 2019-06-06 NOTE — Progress Notes (Signed)
Reason for the request:   Renal mass  HPI: I was asked by Dr. Gloriann Loan to evaluate Mark Burgess for the evaluation of renal neoplasm.  He is a 79 year old man with history of coronary disease, diabetes and hypertension who was found to have renal mass.  He is a former smoker who had a CT scan in November 2020 for a follow-up for a pulmonary nodule.  At that time he was noted to have an interval growth of a left lower lobe mass measuring 1.3 x 1.0 cm.  PET CT scan was recommended which was completed on April 26, 2019. The scan showed a large exophytic mass from the lower pole of the right kidney concerning for a primary kidney neoplasm.  Low metabolic activity associated with the left lower lobe pulmonary nodule favors a benign etiology.  He was evaluated by Dr. Gloriann Loan and scheduled to have nephrectomy in February 2021.  Clinically, he reports no complaints at this time.  He denies any back pain, flank pain or hematuria.  He denies any weight loss or appetite changes.  Denies any respiratory complaints.   He does not report any headaches, blurry vision, syncope or seizures. Does not report any fevers, chills or sweats.  Does not report any cough, wheezing or hemoptysis.  Does not report any chest pain, palpitation, orthopnea or leg edema.  Does not report any nausea, vomiting or abdominal pain.  Does not report any constipation or diarrhea.  Does not report any skeletal complaints.    Does not report frequency, urgency or hematuria.  Does not report any skin rashes or lesions. Does not report any heat or cold intolerance.  Does not report any lymphadenopathy or petechiae.  Does not report any anxiety or depression.  Remaining review of systems is negative.    Past Medical History:  Diagnosis Date  . Aortic stenosis    apparent bicuspid aortic valve  . CAD (coronary artery disease)    subtotal occlusion of mid left circumflex, treated successfully w percutaneous coronary intervention using a 3.0- x 16-mm  drug eluting stent. Percent stenois was taken from 95-0 w TIMI 2 flow taken to TIMI 3 flow post percutaneous coronary intervention 11/30/17 PCI/DES x2 to the mLAD  . Diabetes mellitus    type II   . 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 40%  . Emphysema lung ([place])   . GERD (gastroesophageal reflux disease)   . History of kidney stones   . HTN (hypertension)    x8 years  . Myocardial infarction [facility])    2012  :  Past Surgical History:  Procedure Laterality Date  . APPENDECTOMY    . CARDIAC CATHETERIZATION    . COLONOSCOPY WITH PROPOFOL N/A 03/27/2015   Procedure: COLONOSCOPY WITH PROPOFOL;  Surgeon: Manya Silvas, MD;  Location: [facility] ENDOSCOPY;  Service: Endoscopy;  Laterality: N/A;  . CORONARY STENT INTERVENTION N/A 11/30/2017   Procedure: CORONARY STENT INTERVENTION;  Surgeon: Jettie Booze, MD;  Location: [place] CV LAB;  Service: Cardiovascular;  Laterality: N/A;  . CYSTOSCOPY WITH STENT PLACEMENT Right 03/11/2017   Procedure: CYSTOSCOPY WITH STENT PLACEMENT;  Surgeon: Franchot Gallo, MD;  Location: [facility] ORS;  Service: Urology;  Laterality: Right;  . CYSTOSCOPY WITH URETEROSCOPY AND STENT PLACEMENT Right 04/19/2017   Procedure: CYSTOSCOPY WITH URETEROSCOPY AND STENT Removal;  Surgeon: Franchot Gallo, MD;  Location: [facility] ORS;  Service: Urology;  Laterality: Right;  . CYSTOSCOPY/RETROGRADE/URETEROSCOPY/STONE EXTRACTION WITH BASKET Right 03/11/2017   Procedure:  CYSTOSCOPY/RETROGRADE/URETEROSCOPY/STONE EXTRACTION WITH BASKET;  Surgeon: Franchot Gallo, MD;  Location: [facility] ORS;  Service: Urology;  Laterality: Right;  . EXTRACORPOREAL SHOCK WAVE LITHOTRIPSY Right 02/25/2017   Procedure: RIGHT EXTRACORPOREAL SHOCK WAVE LITHOTRIPSY (ESWL);  Surgeon: Cleon Gustin, MD;  Location: [facility] ORS;  Service: Urology;  Laterality: Right;  . HOLMIUM LASER APPLICATION Right [id number]   Procedure: HOLMIUM LASER APPLICATION;  Surgeon: Franchot Gallo, MD;   Location: [facility] ORS;  Service: Urology;  Laterality: Right;  . IR URETERAL STENT LEFT NEW ACCESS W/O SEP NEPHROSTOMY CATH  07/09/2017  . [place], 2006  . LEFT HEART CATH AND CORONARY ANGIOGRAPHY N/A 11/30/2017   Procedure: LEFT HEART CATH AND CORONARY ANGIOGRAPHY;  Surgeon: Jettie Booze, MD;  Location: [place] [facility] LAB;  Service: Cardiovascular;  Laterality: N/A;  . NEPHROLITHOTOMY Left 07/12/2017   Procedure: NEPHROLITHOTOMY PERCUTANEOUS;  Surgeon: Franchot Gallo, MD;  Location: [facility];  Service: Urology;  Laterality: Left;  . TONSILLECTOMY    :   Current Outpatient Medications:  .  ADVAIR HFA 115-21 MCG/ACT inhaler, Inhale 2 puffs into the lungs every 12 (twelve) hours. (0900 & 2100), Disp: , Rfl:  .  allopurinol (ZYLOPRIM) 300 MG tablet, Take 300 mg by mouth daily. , Disp: , Rfl:  .  amLODipine (NORVASC) 2.5 MG tablet, Take 1 tablet (2.5 mg total) by mouth daily., Disp: 90 tablet, Rfl: 3 .  aspirin EC 81 MG tablet, Take 81 mg by mouth daily., Disp: , Rfl:  .  carvedilol (COREG) 12.5 MG tablet, Take 1 tablet (12.5 mg total) by mouth 2 (two) times daily., Disp: 60 tablet, Rfl: 5 .  clopidogrel (PLAVIX) 75 MG tablet, TAKE 1 TABLET (75 MG TOTAL) BY MOUTH DAILY WITH BREAKFAST., Disp: 90 tablet, Rfl: 3 .  finasteride (PROSCAR) 5 MG tablet, Take 5 mg by mouth daily. , Disp: , Rfl:  .  latanoprost (XALATAN) 0.005 % ophthalmic solution, Place 1 drop into both eyes at bedtime. , Disp: , Rfl:  .  lisinopril-hydrochlorothiazide (PRINZIDE,ZESTORETIC) 20-12.5 MG per tablet, Take 1 tablet by mouth daily. , Disp: , Rfl:  .  metFORMIN (GLUCOPHAGE-XR) 500 MG 24 hr tablet, Take 500 mg by mouth 2 (two) times daily. , Disp: , Rfl: 3 .  Multiple Vitamins-Minerals (CENTRUM MEN PO), Take 1 tablet by mouth daily. , Disp: , Rfl:  .  nitroGLYCERIN (NITROSTAT) 0.4 MG SL tablet, PLACE 1 TABLET (0.4 MG TOTAL) UNDER THE TONGUE EVERY 5 (FIVE) MINUTES AS NEEDED., Disp: 25 tablet, Rfl: 2 .  pantoprazole  (PROTONIX) 40 MG tablet, TAKE 1 TABLET BY MOUTH EVERY DAY, Disp: 90 tablet, Rfl: 3 .  rosuvastatin (CRESTOR) 40 MG tablet, Take 1 tablet (40 mg total) by mouth daily., Disp: 90 tablet, Rfl: 3 .  spironolactone (ALDACTONE) 25 MG tablet, Take 25 mg by mouth daily. , Disp: , Rfl: :  No Known Allergies:  Family History  Problem Relation Age of Onset  . Heart disease Father        Died 86  . Healthy Mother   :  Social History   Socioeconomic History  . Marital status: Married    Spouse name: Not on file  . Number of children: 1  . Years of education: Not on file  . Highest education level: Not on file  Occupational History    Employer: RETIRED  Tobacco Use  . Smoking status: Former Smoker    Quit date: 09/14/1981    Years since quitting: 37.7  . Smokeless tobacco:  Never Used  . Tobacco comment: 1 ppd x 15 years, quit 30 years ago   Substance and Sexual Activity  . Alcohol use: Yes    Comment: occ  . Drug use: No  . Sexual activity: Not on file  Other Topics Concern  . Not on file  Social History Narrative   Married.    Social Determinants of Health   Financial Resource Strain:   . Difficulty of Paying Living Expenses: Not on file  Food Insecurity:   . Worried About Charity fundraiser in the Last Year: Not on file  . Ran Out of Food in the Last Year: Not on file  Transportation Needs:   . Lack of Transportation (Medical): Not on file  . Lack of Transportation (Non-Medical): Not on file  Physical Activity:   . Days of Exercise per Week: Not on file  . Minutes of Exercise per Session: Not on file  Stress:   . Feeling of Stress : Not on file  Social Connections:   . Frequency of Communication with Friends and Family: Not on file  . Frequency of Social Gatherings with Friends and Family: Not on file  . Attends Religious Services: Not on file  . Active Member of Clubs or Organizations: Not on file  . Attends Archivist Meetings: Not on file  . Marital Status:  Not on file  Intimate Partner Violence:   . Fear of Current or Ex-Partner: Not on file  . Emotionally Abused: Not on file  . Physically Abused: Not on file  . Sexually Abused: Not on file  :  Pertinent items are noted in HPI.  Exam: Blood pressure (!) 157/69, pulse 73, temperature (!) 97.5 F (36.4 C), temperature source Temporal, resp. rate 17, height 6\' 2"  (1.88 m), weight 206 lb 11.2 oz (93.8 kg), SpO2 96 %.  ECOG 1  General appearance: alert and cooperative appeared without distress. Head: atraumatic without any abnormalities. Eyes: conjunctivae/corneas clear. PERRL.  Sclera anicteric. Throat: lips, mucosa, and tongue normal; without oral thrush or ulcers. Resp: clear to auscultation bilaterally without rhonchi, wheezes or dullness to percussion. Cardio: regular rate and rhythm, S1, S2 normal, no murmur, click, rub or gallop GI: soft, non-tender; bowel sounds normal; no masses,  no organomegaly Skin: Skin color, texture, turgor normal. No rashes or lesions Lymph nodes: Cervical, supraclavicular, and axillary nodes normal. Neurologic: Grossly normal without any motor, sensory or deep tendon reflexes. Musculoskeletal: No joint deformity or effusion.   CBC    Component Value Date/Time   WBC 7.9 12/01/2017 0419   RBC 3.99 (L) 12/01/2017 0419   HGB 11.7 (L) 12/01/2017 0419   HGB 12.8 (L) 11/25/2017 1240   HCT 36.1 (L) 12/01/2017 0419   HCT 38.6 11/25/2017 1240   PLT 156 12/01/2017 0419   PLT 218 11/25/2017 1240   MCV 90.5 12/01/2017 0419   MCV 89 11/25/2017 1240   MCH 29.3 12/01/2017 0419   MCHC 32.4 12/01/2017 0419   RDW 13.7 12/01/2017 0419   RDW 14.0 11/25/2017 1240   LYMPHSABS 2.7 07/09/2017 0936   MONOABS 0.8 07/09/2017 0936   EOSABS 0.2 07/09/2017 0936   BASOSABS 0.0 07/09/2017 0936     Chemistry      Component Value Date/Time   NA 139 12/01/2017 0419   NA 140 11/25/2017 1240   K 3.9 12/01/2017 0419   CL 105 12/01/2017 0419   CO2 25 12/01/2017 0419    BUN 19 12/01/2017 0419   BUN 30 (  H) 11/25/2017 1240   CREATININE 1.32 (H) 12/01/2017 0419      Component Value Date/Time   CALCIUM 10.0 12/01/2017 0419   ALKPHOS 44 05/25/2010 0356   AST 38 (H) 05/25/2010 0356   ALT 33 05/25/2010 0356   BILITOT 0.4 05/25/2010 0356     IMPRESSION: 1. Interval growth of medial left lower lobe irregular solid 1.3 x 1.0 cm pulmonary nodule. Primary bronchogenic carcinoma not excluded. PET-CT recommended for further characterization. 2. Additional subcentimeter bilateral pulmonary nodules are all stable and are probably benign. 3. No thoracic adenopathy. 4. Stable ectatic 4.1 cm ascending thoracic aorta. Recommend annual imaging followup by CTA or MRA. This recommendation follows 2010 ACCF/AHA/AATS/ACR/ASA/SCA/SCAI/SIR/STS/SVM Guidelines for the Diagnosis and Management of Patients with Thoracic Aortic Disease. Circulation. 2010; [id number]. Aortic aneurysm NOS (ICD10-I71.9). 5. Three-vessel coronary atherosclerosis.  IMPRESSION: 1. 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 Recommend dedicated CT or MRI renal protocol and urology consultation. 2. Low metabolic activity associated with the LEFT lobe pulmonary nodule favors benign etiology; however, low-grade carcinomas and metastatic lesions from certain tissue origins can have low metabolic activity. Recommend follow-up CT in 3 to evaluate for change in size. 3.  Aortic Atherosclerosis (ICD10-I70.0).  Assessment and Plan:   79 year old man with:  1.  Right kidney mass measuring 6.2 x 4.8 cm arising from the lower pole detected on PET CT scan on April 26, 2019.  This was obtained for a evaluation for lung nodule as well.  The differential diagnosis was reviewed today.  Given the size and the location of this mass this is suspicious for primary kidney neoplasm renal cell carcinoma is the most likely etiology.   Transitional cell carcinoma of the kidney considered less likely at this time.  Management options at this time were reviewed and primary surgical therapy remains the treatment of choice at this time.  Is really no clear-cut evidence of metastatic disease other than a undetermined pulmonary nodule.  The standard of care at this time even if the pulmonary nodule is considered malignant, to proceed with upfront primary nephrectomy.  Additional systemic therapy may be needed in the future pending further evaluation after completing surgery.  I would recommend repeat imaging studies 3 months after that including CT scan chest abdomen and pelvis to fully evaluate and follow-up any additional signs or symptoms metastatic disease.  At that time systemic therapy will be considered.   He is agreeable with this plan and all his questions were answered today.  2.  Pulmonary nodules: He was found to have a 1.3 x 1.0 cm left lower lung nodule which has grown over the last 6 months.  CT scan showed low metabolic activity which favors benign etiology however low-grade carcinoma could not be ruled out.  I recommend repeat imaging studies after completing his surgery for surveillance purposes.  Surgical resection versus possible ablation could be considered if if the nodule continues to grow.   3.  Follow-up: Will be in 6 months for repeat evaluation.   60  minutes was on this encounter.  The time was dedicated to reviewing imaging studies, laboratory data, discussing differential diagnosis and management options.     Thank you for the referral.  A copy of this consult has been forwarded to the requesting physician.

## 2019-06-07 ENCOUNTER — Telehealth: Payer: Self-pay | Admitting: Oncology

## 2019-06-07 NOTE — Telephone Encounter (Signed)
Scheduled appt per 1/26 los.  Sent a message to HIM pool to get a calendar mailed out. 

## 2019-06-14 NOTE — Patient Instructions (Addendum)
DUE TO COVID-19 ONLY ONE VISITOR IS ALLOWED TO COME WITH YOU AND STAY IN THE WAITING ROOM ONLY DURING PRE OP AND PROCEDURE DAY OF SURGERY. THE 1 VISITOR MAY VISIT WITH YOU AFTER SURGERY IN YOUR PRIVATE ROOM DURING VISITING HOURS ONLY!  YOU NEED TO HAVE A COVID 19 TEST ON___2/9/2021____ @____11 :00AM___, THIS TEST MUST BE DONE BEFORE SURGERY, COME  [place] , [id number].  ([place]) ONCE YOUR COVID TEST IS COMPLETED, PLEASE BEGIN THE QUARANTINE INSTRUCTIONS AS OUTLINED IN YOUR HANDOUT.                Mark Burgess    Your procedure is scheduled on: 03-Jul-2019   Report to [facility] Main  Entrance   Report to admitting at 10:30AM     Call this number if you have problems the morning of surgery [phone]    Remember: Follow all instructions, if any,  provided by your surgeon. Do not eat food or drink liquids :After Midnight.   BRUSH YOUR TEETH MORNING OF SURGERY AND RINSE YOUR MOUTH OUT, NO CHEWING GUM CANDY OR MINTS.     Take these medicines the morning of surgery with A SIP OF WATER: AMLODIPINE, CARVEDILOL, ALLOPURINOL, PANTOPRAZOLE, FINASTERIDE, ROSUVASTATIN, ADVAIR INHALER   How to Manage Your Diabetes Before and After Surgery   Why is it important to control my blood sugar before and after surgery? . Improving blood sugar levels before and after surgery helps healing and can limit problems. . A way of improving blood sugar control is eating a healthy diet by: o  Eating less sugar and carbohydrates o  Increasing activity/exercise o  Talking with your doctor about reaching your blood sugar goals . High blood sugars (greater than 180 mg/dL) can raise your risk of infections and slow your recovery, so you will need to focus on controlling your diabetes during the weeks before surgery. . Make sure that the doctor who takes care of your diabetes knows about your planned surgery including the date and location.  How do I manage  my blood sugar before surgery? . Check your blood sugar at least 4 times a day, starting 2 days before surgery, to make sure that the level is not too high or low. o Check your blood sugar the morning of your surgery when you wake up and every 2 hours until you get to the Short Stay unit. . If your blood sugar is less than 70 mg/dL, you will need to treat for low blood sugar: o Do not take insulin. o Treat a low blood sugar (less than 70 mg/dL) with  cup of clear juice (cranberry or apple), 4 glucose tablets, OR glucose gel. o Recheck blood sugar in 15 minutes after treatment (to make sure it is greater than 70 mg/dL). If your blood sugar is not greater than 70 mg/dL on recheck, call [phone] for further instructions. . Report your blood sugar to the short stay nurse when you get to Short Stay.  . If you are admitted to the hospital after surgery: o Your blood sugar will be checked by the staff and you will probably be given insulin after surgery (instead of oral diabetes medicines) to make sure you have good blood sugar levels. o The goal for blood sugar control after surgery is 80-180 mg/dL.   WHAT DO I DO ABOUT MY DIABETES MEDICATION?      DO NOT TAKE ANY DIABETIC MEDICATIONS DAY OF YOUR SURGERY   Reviewed  and Endorsed by [facility] Patient Education Committee, August 2015                                You may not have any metal on your body including hair pins and              piercings  Do not wear jewelry, make-up, lotions, powders or perfumes, deodorant             Men may shave face and neck.    Do not bring valuables to the hospital. [place].  Contacts, dentures or bridgework may not be worn into surgery.  YOU MAY BRING A SMALL OVERNIGHT BAG                   Please read over the following fact sheets you were given: _____________________________________________________________________             [facility]  - Preparing for Surgery Before surgery, you can play an important role.  Because skin is not sterile, your skin needs to be as free of germs as possible.  You can reduce the number of germs on your skin by washing with CHG (chlorahexidine gluconate) soap before surgery.  CHG is an antiseptic cleaner which kills germs and bonds with the skin to continue killing germs even after washing. Please DO NOT use if you have an allergy to CHG or antibacterial soaps.  If your skin becomes reddened/irritated stop using the CHG and inform your nurse when you arrive at Short Stay. Do not shave (including legs and underarms) for at least 48 hours prior to the first CHG shower.  You may shave your face/neck. Please follow these instructions carefully:  1.  Shower with CHG Soap the night before surgery and the  morning of Surgery.  2.  If you choose to wash your hair, wash your hair first as usual with your  normal  shampoo.  3.  After you shampoo, rinse your hair and body thoroughly to remove the  shampoo.                           4.  Use CHG as you would any other liquid soap.  You can apply chg directly  to the skin and wash                       Gently with a scrungie or clean washcloth.  5.  Apply the CHG Soap to your body ONLY FROM THE NECK DOWN.   Do not use on face/ open                           Wound or open sores. Avoid contact with eyes, ears mouth and genitals (private parts).                       Wash face,  Genitals (private parts) with your normal soap.             6.  Wash thoroughly, paying special attention to the area where your surgery  will be performed.  7.  Thoroughly rinse your body with warm water from the neck down.  8.  DO  NOT shower/wash with your normal soap after using and rinsing off  the CHG Soap.                9.  Pat yourself dry with a clean towel.            10.  Wear clean pajamas.            11.  Place clean sheets on your bed the night of your first shower and do not  sleep  with pets. Day of Surgery : Do not apply any lotions/deodorants the morning of surgery.  Please wear clean clothes to the hospital/surgery center.  FAILURE TO FOLLOW THESE INSTRUCTIONS MAY RESULT IN THE CANCELLATION OF YOUR SURGERY PATIENT SIGNATURE_________________________________  NURSE SIGNATURE__________________________________  ________________________________________________________________________   WHAT IS A BLOOD TRANSFUSION? Blood Transfusion Information  A transfusion is the replacement of blood or some of its parts. Blood is made up of multiple cells which provide different functions.  Red blood cells carry oxygen and are used for blood loss replacement.  White blood cells fight against infection.  Platelets control bleeding.  Plasma helps clot blood.  Other blood products are available for specialized needs, such as hemophilia or other clotting disorders. BEFORE THE TRANSFUSION  Who gives blood for transfusions?   Healthy volunteers who are fully evaluated to make sure their blood is safe. This is blood bank blood. Transfusion therapy is the safest it has ever been in the practice of medicine. Before blood is taken from a donor, a complete history is taken to make sure that person has no history of diseases nor engages in risky social behavior (examples are intravenous drug use or sexual activity with multiple partners). The donor's travel history is screened to minimize risk of transmitting infections, such as malaria. The donated blood is tested for signs of infectious diseases, such as HIV and hepatitis. The blood is then tested to be sure it is compatible with you in order to minimize the chance of a transfusion reaction. If you or a relative donates blood, this is often done in anticipation of surgery and is not appropriate for emergency situations. It takes many days to process the donated blood. RISKS AND COMPLICATIONS Although transfusion therapy is very safe and  saves many lives, the main dangers of transfusion include:   Getting an infectious disease.  Developing a transfusion reaction. This is an allergic reaction to something in the blood you were given. Every precaution is taken to prevent this. The decision to have a blood transfusion has been considered carefully by your caregiver before blood is given. Blood is not given unless the benefits outweigh the risks. AFTER THE TRANSFUSION  Right after receiving a blood transfusion, you will usually feel much better and more energetic. This is especially true if your red blood cells have gotten low (anemic). The transfusion raises the level of the red blood cells which carry oxygen, and this usually causes an energy increase.  The nurse administering the transfusion will monitor you carefully for complications. HOME CARE INSTRUCTIONS  No special instructions are needed after a transfusion. You may find your energy is better. Speak with your caregiver about any limitations on activity for underlying diseases you may have. SEEK MEDICAL CARE IF:   Your condition is not improving after your transfusion.  You develop redness or irritation at the intravenous (IV) site. SEEK IMMEDIATE MEDICAL CARE IF:  Any of the following symptoms occur over the next 12 hours:  Shaking chills.  You  have a temperature by mouth above 102 F (38.9 C), not controlled by medicine.  Chest, back, or muscle pain.  People around you feel you are not acting correctly or are confused.  Shortness of breath or difficulty breathing.  Dizziness and fainting.  You get a rash or develop hives.  You have a decrease in urine output.  Your urine turns a dark color or changes to pink, red, or brown. Any of the following symptoms occur over the next 10 days:  You have a temperature by mouth above 102 F (38.9 C), not controlled by medicine.  Shortness of breath.  Weakness after normal activity.  The white part of the eye  turns yellow (jaundice).  You have a decrease in the amount of urine or are urinating less often.  Your urine turns a dark color or changes to pink, red, or brown. Document Released: 04/24/2000 Document Revised: 07/20/2011 Document Reviewed: 12/12/2007 [facility] Patient Information 2014 [place], [place].  _______________________________________________________________________

## 2019-06-16 ENCOUNTER — Encounter (HOSPITAL_COMMUNITY)
Admission: RE | Admit: 2019-06-16 | Discharge: 2019-06-16 | Disposition: A | Discharge status: Home / Self Care | Payer: Medicare HMO | Source: Ambulatory Visit | Attending: Urology | Admitting: Urology

## 2019-06-16 ENCOUNTER — Encounter (HOSPITAL_COMMUNITY): Payer: Self-pay

## 2019-06-16 ENCOUNTER — Other Ambulatory Visit: Payer: Self-pay

## 2019-06-16 DIAGNOSIS — Z01812 Encounter for preprocedural laboratory examination: Secondary | ICD-10-CM | POA: Insufficient documentation

## 2019-06-16 DIAGNOSIS — Z79899 Other long term (current) drug therapy: Secondary | ICD-10-CM | POA: Insufficient documentation

## 2019-06-16 DIAGNOSIS — J439 Emphysema, unspecified: Secondary | ICD-10-CM | POA: Insufficient documentation

## 2019-06-16 DIAGNOSIS — N2889 Other specified disorders of kidney and ureter: Secondary | ICD-10-CM | POA: Insufficient documentation

## 2019-06-16 DIAGNOSIS — Z7902 Long term (current) use of antithrombotics/antiplatelets: Secondary | ICD-10-CM | POA: Insufficient documentation

## 2019-06-16 DIAGNOSIS — I2510 Atherosclerotic heart disease of native coronary artery without angina pectoris: Secondary | ICD-10-CM | POA: Insufficient documentation

## 2019-06-16 DIAGNOSIS — Z87891 Personal history of nicotine dependence: Secondary | ICD-10-CM | POA: Diagnosis not present

## 2019-06-16 DIAGNOSIS — E118 Type 2 diabetes mellitus with unspecified complications: Secondary | ICD-10-CM | POA: Diagnosis not present

## 2019-06-16 DIAGNOSIS — I10 Essential (primary) hypertension: Secondary | ICD-10-CM | POA: Insufficient documentation

## 2019-06-16 DIAGNOSIS — Z7982 Long term (current) use of aspirin: Secondary | ICD-10-CM | POA: Diagnosis not present

## 2019-06-16 DIAGNOSIS — K219 Gastro-esophageal reflux disease without esophagitis: Secondary | ICD-10-CM | POA: Diagnosis not present

## 2019-06-16 DIAGNOSIS — I252 Old myocardial infarction: Secondary | ICD-10-CM | POA: Insufficient documentation

## 2019-06-16 DIAGNOSIS — Z7901 Long term (current) use of anticoagulants: Secondary | ICD-10-CM | POA: Diagnosis not present

## 2019-06-16 DIAGNOSIS — Z7984 Long term (current) use of oral hypoglycemic drugs: Secondary | ICD-10-CM | POA: Diagnosis not present

## 2019-06-16 LAB — BASIC METABOLIC PANEL
Anion gap: 7 (ref 5–15)
BUN: 33 mg/dL — ABNORMAL HIGH (ref 8–23)
CO2: 27 mmol/L (ref 22–32)
Calcium: 10.4 mg/dL — ABNORMAL HIGH (ref 8.9–10.3)
Chloride: 106 mmol/L (ref 98–111)
Creatinine, Ser: 1.64 mg/dL — ABNORMAL HIGH (ref 0.61–1.24)
GFR calc Af Amer: 46 mL/min — ABNORMAL LOW (ref >60)
GFR calc non Af Amer: 40 mL/min — ABNORMAL LOW (ref >60)
Glucose, Bld: 154 mg/dL — ABNORMAL HIGH (ref 70–99)
Potassium: 4.4 mmol/L (ref 3.5–5.1)
Sodium: 140 mmol/L (ref 135–145)

## 2019-06-16 LAB — CBC
HCT: 39 % (ref 39.0–52.0)
Hemoglobin: 12.2 g/dL — ABNORMAL LOW (ref 13.0–17.0)
MCH: 29 pg (ref 26.0–34.0)
MCHC: 31.3 g/dL (ref 30.0–36.0)
MCV: 92.6 fL (ref 80.0–100.0)
Platelets: 207 10*3/uL (ref 150–400)
RBC: 4.21 MIL/uL — ABNORMAL LOW (ref 4.22–5.81)
RDW: 13.8 % (ref 11.5–15.5)
WBC: 8.5 10*3/uL (ref 4.0–10.5)
nRBC: 0 % (ref 0.0–0.2)

## 2019-06-16 LAB — HEMOGLOBIN A1C
Hgb A1c MFr Bld: 6.3 % — ABNORMAL HIGH (ref 4.8–5.6)
Mean Plasma Glucose: 134.11 mg/dL

## 2019-06-16 LAB — PROTIME-INR
INR: 0.9 (ref 0.8–1.2)
Prothrombin Time: 12.3 seconds (ref 11.4–15.2)

## 2019-06-16 NOTE — Progress Notes (Addendum)
PCP - Kirk Ruths, MD Cardiologist - Minus Breeding, MD, lov 01-12-2019 epic  Cardiac clearance Daune Perch NP 05/30/2019 epic tele note  Chest x-ray - CT chest 04/10/2019 epic  EKG - 12/21/2018 epic  Stress Test - 12/23/2018 epic, EF 33% ECHO - 2018. EF 40/45% Cardiac Cath - 11/30/2017 epic   Sleep Study -  CPAP -   Fasting Blood Sugar - pt cannot recall Checks Blood Sugar __once a week  Blood Thinner Instructions:PLAVIX, hold x1 week Aspirin Instructions: continue  Last Dose:plavix 06/14/2019, asa continued  Anesthesia review:   aortic stenosis noted on med hx? , hx of low E, htn, DM2, MI, CAD, emphysema;  cardiac cath with PCI 11/30/2017 epic . PA to review chart   Patient denies shortness of breath, fever, cough and chest pain at PAT appointment   Patient verbalized understanding of instructions that were given to them at the PAT appointment. Patient was also instructed that they will need to review over the PAT instructions again at home before surgery.

## 2019-06-20 ENCOUNTER — Other Ambulatory Visit (HOSPITAL_COMMUNITY)
Admission: RE | Admit: 2019-06-20 | Discharge: 2019-06-20 | Disposition: A | Discharge status: Home / Self Care | Payer: Medicare HMO | Source: Ambulatory Visit | Attending: Urology | Admitting: Urology

## 2019-06-20 LAB — SARS CORONAVIRUS 2 (TAT 6-24 HRS): SARS Coronavirus 2: NEGATIVE

## 2019-06-20 NOTE — Progress Notes (Signed)
Anesthesia Chart Review   Case: Mark Burgess Date/Time: 2019/06/24 1215   Procedure: RIGHT HAND ASSISTED LAPAROSCOPIC NEPHRECTOMY (Right )   Anesthesia type: General   Pre-op diagnosis: RIGHT RENAL MASS   Location: [facility] ROOM 03 / [facility]   Surgeons: Lucas Mallow, MD      DISCUSSION:79 y.o. former smoker (quit 09/14/81) with h/o HTN, CAD (DES x 2 to mLAD 11/30/17), Dm II, GERD, COPD, aortic stenosis, right renal mass scheduled for above procedure 06/24/2019 with Dr. Link Snuffer.    Pt cleared by cardiology.  Per Daune Perch, NP, "Patient was contacted 06/05/2019 in reference to pre-operative risk assessment for pending surgery as outlined below.  Deanna Hanish was last seen on 01/12/2019 by Dr. Lolly Mustache.  Since that day, Caedon Keniston has done well. He has stable CAD and mildly reduced EF of 40%. He is having no anginal or heart failure symptoms. He does yardwork and walks in his neighborhood several times per day without any exertional symptoms. 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  Therefore, based on ACC/AHA guidelines, the patient would be at acceptable risk for the planned procedure without further cardiovascular testing.  He will be OK to hold Plavix before the procedure. Restart afterward please."  Pt advised to hold Plavix 1 week prior to procedure.  VS: BP 130/69   Pulse 75   Temp 36.6 C (Oral)   Resp 18   Ht 6\' 2"  (1.88 m)   Wt 92.1 kg   SpO2 96%   BMI 26.06 kg/m   PROVIDERS: Kirk Ruths, MD is PCP   Minus Breeding, MD is Cardiologist  LABS: Labs reviewed: Acceptable for surgery. (all labs ordered are listed, but only abnormal results are displayed)  Labs Reviewed  HEMOGLOBIN A1C - Abnormal; Notable for the following components:      Result Value   Hgb A1c MFr Bld 6.3 (*)    All other components within normal limits  BASIC METABOLIC PANEL - Abnormal; Notable for the following components:   Glucose, Bld 154 (*)    BUN 33 (*)     Creatinine, Ser 1.64 (*)    Calcium 10.4 (*)    GFR calc non Af Amer 40 (*)    GFR calc Af Amer 46 (*)    All other components within normal limits  CBC - Abnormal; Notable for the following components:   RBC 4.21 (*)    Hemoglobin 12.2 (*)    All other components within normal limits  PROTIME-INR  TYPE AND SCREEN     IMAGES:   EKG: 12/21/2018 Rate 73 bpm Sinus rhythm with 1st degree AV Block  Left axis deviation  Nonspecific intraventricular block   CV: Myocardial Perfusion 12/23/2018  The left ventricular ejection fraction is moderately decreased (30-44%).  Nuclear stress EF: 38%.  Findings consistent with prior myocardial infarction with peri-infarct ischemia.  This is an intermediate risk study.   Diaphragmatic attenuation lowers specificity of study. Inferior wall infarct mid and basal with moderate peri infarct ischemia Involving the mid and apical inferior wall SDS 7  EF estimated at 38% with apical hypokinesis but may not be accurate  As patient in afib Borderline TID 1.2   Cardiac Cath 11/30/2017  Previously placed Mid Cx stent (unknown type) is widely patent.  Ost Cx to Prox Cx lesion is 25% stenosed.  Prox LAD lesion is 25% stenosed.  Mid LAD lesion is 99% stenosed.  Mid LAD to Dist LAD lesion is 70%  stenosed.  A drug-eluting stent was successfully placed using a STENT SYNERGY DES 2.5X32.  A drug-eluting stent was successfully placed using a STENT SYNERGY DES 2.75X20, overlapping the first stent.  Post intervention, there is a 0% residual stenosis.  Dist LAD lesion is 75% stenosed. This is too far distal for PCI.   Recommend uninterrupted dual antiplatelet therapy with Aspirin 81mg  daily and Clopidogrel 75mg  daily for a minimum of 6 months (stable ischemic heart disease - Class I recommendation).  Continue aggressive secondary prevention.   Echo 07/15/2016 Study Conclusions   - Left ventricle: The cavity size was normal. There was mild   concentric hypertrophy. Systolic function was mildly to  moderately reduced. The estimated ejection fraction was in the  range of 40% to 45%. There is hypokinesis in the basal  anteroseptal, inferoseptal and inferior walls. Doppler parameters  are consistent with abnormal left ventricular relaxation (grade 1  diastolic dysfunction).  - Aortic valve: Trileaflet; mildly thickened, mildly calcified  leaflets.  - Ascending aorta: The ascending aorta was mildly dilated measuring  40 mm.  - Mitral valve: There was mild regurgitation.  - Left atrium: The atrium was severely dilated.  - Right ventricle: Systolic function was normal.  - Tricuspid valve: There was mild regurgitation.  - Pulmonary arteries: Systolic pressure was within the normal  range.  - Inferior vena cava: The vessel was normal in size.  - Pericardium, extracardiac: There was no pericardial effusion.  Past Medical History:  Diagnosis Date  . Aortic stenosis    apparent bicuspid aortic valve  . CAD (coronary artery disease)    subtotal occlusion of mid left circumflex, treated successfully w percutaneous coronary intervention using a 3.0- x 16-mm drug eluting stent. Percent stenois was taken from 95-0 w TIMI 2 flow taken to TIMI 3 flow post percutaneous coronary intervention 11/30/17 PCI/DES x2 to the mLAD  . Diabetes mellitus    type II   . 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 40%  . Emphysema lung ([place])   . GERD (gastroesophageal reflux disease)   . History of kidney stones   . HTN (hypertension)    x8 years  . Myocardial infarction [facility])    2012    Past Surgical History:  Procedure Laterality Date  . APPENDECTOMY    . CARDIAC CATHETERIZATION    . COLONOSCOPY WITH PROPOFOL N/A 03/27/2015   Procedure: COLONOSCOPY WITH PROPOFOL;  Surgeon: Manya Silvas, MD;  Location: [facility] ENDOSCOPY;  Service: Endoscopy;  Laterality: N/A;  . CORONARY STENT INTERVENTION N/A 11/30/2017    Procedure: CORONARY STENT INTERVENTION;  Surgeon: Jettie Booze, MD;  Location: [place] CV LAB;  Service: Cardiovascular;  Laterality: N/A;  . CYSTOSCOPY WITH STENT PLACEMENT Right 03/11/2017   Procedure: CYSTOSCOPY WITH STENT PLACEMENT;  Surgeon: Franchot Gallo, MD;  Location: [facility];  Service: Urology;  Laterality: Right;  . CYSTOSCOPY WITH URETEROSCOPY AND STENT PLACEMENT Right 04/19/2017   Procedure: CYSTOSCOPY WITH URETEROSCOPY AND STENT Removal;  Surgeon: Franchot Gallo, MD;  Location: [facility];  Service: Urology;  Laterality: Right;  . CYSTOSCOPY/RETROGRADE/URETEROSCOPY/STONE EXTRACTION WITH BASKET Right 03/11/2017   Procedure: CYSTOSCOPY/RETROGRADE/URETEROSCOPY/STONE EXTRACTION WITH BASKET;  Surgeon: Franchot Gallo, MD;  Location: [facility];  Service: Urology;  Laterality: Right;  . EXTRACORPOREAL SHOCK WAVE LITHOTRIPSY Right 02/25/2017   Procedure: RIGHT EXTRACORPOREAL SHOCK WAVE LITHOTRIPSY (ESWL);  Surgeon: Cleon Gustin, MD;  Location: [facility];  Service: Urology;  Laterality: Right;  . HOLMIUM LASER APPLICATION Right [id number]  Procedure: HOLMIUM LASER APPLICATION;  Surgeon: Franchot Gallo, MD;  Location: [facility];  Service: Urology;  Laterality: Right;  . IR URETERAL STENT LEFT NEW ACCESS W/O SEP NEPHROSTOMY CATH  07/09/2017  . [place], 2006  . LEFT HEART CATH AND CORONARY ANGIOGRAPHY N/A 11/30/2017   Procedure: LEFT HEART CATH AND CORONARY ANGIOGRAPHY;  Surgeon: Jettie Booze, MD;  Location: [place] CV LAB;  Service: Cardiovascular;  Laterality: N/A;  . NEPHROLITHOTOMY Left 07/12/2017   Procedure: NEPHROLITHOTOMY PERCUTANEOUS;  Surgeon: Franchot Gallo, MD;  Location: [facility];  Service: Urology;  Laterality: Left;  . TONSILLECTOMY      MEDICATIONS: . ADVAIR HFA 115-21 MCG/ACT inhaler  . allopurinol (ZYLOPRIM) 300 MG tablet  . amLODipine (NORVASC) 2.5 MG tablet  . aspirin EC 81 MG tablet  . carvedilol (COREG) 12.5 MG tablet  .  clopidogrel (PLAVIX) 75 MG tablet  . finasteride (PROSCAR) 5 MG tablet  . latanoprost (XALATAN) 0.005 % ophthalmic solution  . lisinopril-hydrochlorothiazide (PRINZIDE,ZESTORETIC) 20-12.5 MG per tablet  . metFORMIN (GLUCOPHAGE-XR) 500 MG 24 hr tablet  . Multiple Vitamins-Minerals (CENTRUM MEN PO)  . nitroGLYCERIN (NITROSTAT) 0.4 MG SL tablet  . pantoprazole (PROTONIX) 40 MG tablet  . rosuvastatin (CRESTOR) 40 MG tablet  . spironolactone (ALDACTONE) 25 MG tablet   No current facility-administered medications for this encounter.     Maia Plan [facility] Pre-Surgical Testing [phone] 06/20/19  3:52 PM

## 2019-06-23 ENCOUNTER — Other Ambulatory Visit: Payer: Self-pay

## 2019-06-23 ENCOUNTER — Inpatient Hospital Stay (HOSPITAL_COMMUNITY): Payer: Medicare HMO

## 2019-06-23 ENCOUNTER — Inpatient Hospital Stay (HOSPITAL_COMMUNITY): Payer: Medicare HMO | Admitting: Physician Assistant

## 2019-06-23 ENCOUNTER — Encounter (HOSPITAL_COMMUNITY): Payer: Self-pay | Admitting: Urology

## 2019-06-23 ENCOUNTER — Inpatient Hospital Stay (HOSPITAL_COMMUNITY)
Admission: RE | Admit: 2019-06-23 | Discharge: 2019-07-10 | DRG: 656 | Disposition: E | Payer: Medicare HMO | Attending: Urology | Admitting: Urology

## 2019-06-23 ENCOUNTER — Inpatient Hospital Stay (HOSPITAL_COMMUNITY): Payer: Medicare HMO | Admitting: Certified Registered"

## 2019-06-23 ENCOUNTER — Encounter (HOSPITAL_COMMUNITY): Admission: RE | Disposition: E | Payer: Self-pay | Source: Other Acute Inpatient Hospital | Attending: Urology

## 2019-06-23 DIAGNOSIS — G931 Anoxic brain damage, not elsewhere classified: Secondary | ICD-10-CM | POA: Diagnosis not present

## 2019-06-23 DIAGNOSIS — Z7982 Long term (current) use of aspirin: Secondary | ICD-10-CM | POA: Diagnosis not present

## 2019-06-23 DIAGNOSIS — K219 Gastro-esophageal reflux disease without esophagitis: Secondary | ICD-10-CM | POA: Diagnosis present

## 2019-06-23 DIAGNOSIS — Z515 Encounter for palliative care: Secondary | ICD-10-CM | POA: Diagnosis not present

## 2019-06-23 DIAGNOSIS — Z87442 Personal history of urinary calculi: Secondary | ICD-10-CM

## 2019-06-23 DIAGNOSIS — E874 Mixed disorder of acid-base balance: Secondary | ICD-10-CM | POA: Diagnosis present

## 2019-06-23 DIAGNOSIS — I97711 Intraoperative cardiac arrest during other surgery: Secondary | ICD-10-CM | POA: Diagnosis not present

## 2019-06-23 DIAGNOSIS — Y836 Removal of other organ (partial) (total) as the cause of abnormal reaction of the patient, or of later complication, without mention of misadventure at the time of the procedure: Secondary | ICD-10-CM | POA: Diagnosis present

## 2019-06-23 DIAGNOSIS — R578 Other shock: Secondary | ICD-10-CM | POA: Diagnosis not present

## 2019-06-23 DIAGNOSIS — Z8249 Family history of ischemic heart disease and other diseases of the circulatory system: Secondary | ICD-10-CM

## 2019-06-23 DIAGNOSIS — Z7984 Long term (current) use of oral hypoglycemic drugs: Secondary | ICD-10-CM | POA: Diagnosis not present

## 2019-06-23 DIAGNOSIS — C641 Malignant neoplasm of right kidney, except renal pelvis: Secondary | ICD-10-CM | POA: Diagnosis present

## 2019-06-23 DIAGNOSIS — Z20822 Contact with and (suspected) exposure to covid-19: Secondary | ICD-10-CM | POA: Diagnosis present

## 2019-06-23 DIAGNOSIS — Z955 Presence of coronary angioplasty implant and graft: Secondary | ICD-10-CM | POA: Diagnosis not present

## 2019-06-23 DIAGNOSIS — J9601 Acute respiratory failure with hypoxia: Secondary | ICD-10-CM | POA: Diagnosis present

## 2019-06-23 DIAGNOSIS — Y92234 Operating room of hospital as the place of occurrence of the external cause: Secondary | ICD-10-CM | POA: Diagnosis present

## 2019-06-23 DIAGNOSIS — N9961 Intraoperative hemorrhage and hematoma of a genitourinary system organ or structure complicating a genitourinary system procedure: Secondary | ICD-10-CM | POA: Diagnosis not present

## 2019-06-23 DIAGNOSIS — N2889 Other specified disorders of kidney and ureter: Secondary | ICD-10-CM

## 2019-06-23 DIAGNOSIS — J439 Emphysema, unspecified: Secondary | ICD-10-CM | POA: Diagnosis present

## 2019-06-23 DIAGNOSIS — Z66 Do not resuscitate: Secondary | ICD-10-CM | POA: Diagnosis not present

## 2019-06-23 DIAGNOSIS — R911 Solitary pulmonary nodule: Secondary | ICD-10-CM | POA: Diagnosis present

## 2019-06-23 DIAGNOSIS — Z7902 Long term (current) use of antithrombotics/antiplatelets: Secondary | ICD-10-CM

## 2019-06-23 DIAGNOSIS — I2510 Atherosclerotic heart disease of native coronary artery without angina pectoris: Secondary | ICD-10-CM | POA: Diagnosis present

## 2019-06-23 DIAGNOSIS — J9602 Acute respiratory failure with hypercapnia: Secondary | ICD-10-CM | POA: Diagnosis present

## 2019-06-23 DIAGNOSIS — I10 Essential (primary) hypertension: Secondary | ICD-10-CM | POA: Diagnosis present

## 2019-06-23 DIAGNOSIS — I469 Cardiac arrest, cause unspecified: Secondary | ICD-10-CM | POA: Diagnosis not present

## 2019-06-23 DIAGNOSIS — E119 Type 2 diabetes mellitus without complications: Secondary | ICD-10-CM | POA: Diagnosis present

## 2019-06-23 DIAGNOSIS — Z01818 Encounter for other preprocedural examination: Secondary | ICD-10-CM

## 2019-06-23 DIAGNOSIS — Z87891 Personal history of nicotine dependence: Secondary | ICD-10-CM | POA: Diagnosis not present

## 2019-06-23 DIAGNOSIS — I252 Old myocardial infarction: Secondary | ICD-10-CM | POA: Diagnosis not present

## 2019-06-23 HISTORY — PX: LAPAROSCOPIC NEPHRECTOMY, HAND ASSISTED: SHX1929

## 2019-06-23 LAB — PHOSPHORUS: Phosphorus: 9.7 mg/dL — ABNORMAL HIGH (ref 2.5–4.6)

## 2019-06-23 LAB — POCT I-STAT 7, (LYTES, BLD GAS, ICA,H+H)
Acid-base deficit: 10 mmol/L — ABNORMAL HIGH (ref 0.0–2.0)
Bicarbonate: 20.9 mmol/L (ref 20.0–28.0)
Calcium, Ion: 0.97 mmol/L — ABNORMAL LOW (ref 1.15–1.40)
HCT: 20 % — ABNORMAL LOW (ref 39.0–52.0)
Hemoglobin: 6.8 g/dL — CL (ref 13.0–17.0)
O2 Saturation: 100 %
Potassium: 3.6 mmol/L (ref 3.5–5.1)
Sodium: 146 mmol/L — ABNORMAL HIGH (ref 135–145)
TCO2: 23 mmol/L (ref 22–32)
pCO2 arterial: 84.5 mmHg (ref 32.0–48.0)
pH, Arterial: 7.001 — CL (ref 7.350–7.450)
pO2, Arterial: 319 mmHg — ABNORMAL HIGH (ref 83.0–108.0)

## 2019-06-23 LAB — CBC
HCT: 24.5 % — ABNORMAL LOW (ref 39.0–52.0)
Hemoglobin: 8.1 g/dL — ABNORMAL LOW (ref 13.0–17.0)
MCH: 31.4 pg (ref 26.0–34.0)
MCHC: 33.1 g/dL (ref 30.0–36.0)
MCV: 95 fL (ref 80.0–100.0)
Platelets: 63 10*3/uL — ABNORMAL LOW (ref 150–400)
RBC: 2.58 MIL/uL — ABNORMAL LOW (ref 4.22–5.81)
RDW: 13.7 % (ref 11.5–15.5)
WBC: 10.4 10*3/uL (ref 4.0–10.5)
nRBC: 0.2 % (ref 0.0–0.2)

## 2019-06-23 LAB — COMPREHENSIVE METABOLIC PANEL
ALT: 202 U/L — ABNORMAL HIGH (ref 0–44)
AST: 167 U/L — ABNORMAL HIGH (ref 15–41)
Albumin: 1.7 g/dL — ABNORMAL LOW (ref 3.5–5.0)
Alkaline Phosphatase: 36 U/L — ABNORMAL LOW (ref 38–126)
Anion gap: 21 — ABNORMAL HIGH (ref 5–15)
BUN: 36 mg/dL — ABNORMAL HIGH (ref 8–23)
CO2: 18 mmol/L — ABNORMAL LOW (ref 22–32)
Calcium: 8.1 mg/dL — ABNORMAL LOW (ref 8.9–10.3)
Chloride: 106 mmol/L (ref 98–111)
Creatinine, Ser: 1.79 mg/dL — ABNORMAL HIGH (ref 0.61–1.24)
GFR calc Af Amer: 42 mL/min — ABNORMAL LOW (ref >60)
GFR calc non Af Amer: 36 mL/min — ABNORMAL LOW (ref >60)
Glucose, Bld: 296 mg/dL — ABNORMAL HIGH (ref 70–99)
Potassium: 5.1 mmol/L (ref 3.5–5.1)
Sodium: 145 mmol/L (ref 135–145)
Total Bilirubin: 0.4 mg/dL (ref 0.3–1.2)
Total Protein: 3 g/dL — ABNORMAL LOW (ref 6.5–8.1)

## 2019-06-23 LAB — POCT I-STAT, CHEM 8
BUN: 34 mg/dL — ABNORMAL HIGH (ref 8–23)
Calcium, Ion: 0.95 mmol/L — ABNORMAL LOW (ref 1.15–1.40)
Chloride: 102 mmol/L (ref 98–111)
Creatinine, Ser: 1.5 mg/dL — ABNORMAL HIGH (ref 0.61–1.24)
Glucose, Bld: 333 mg/dL — ABNORMAL HIGH (ref 70–99)
HCT: 22 % — ABNORMAL LOW (ref 39.0–52.0)
Hemoglobin: 7.5 g/dL — ABNORMAL LOW (ref 13.0–17.0)
Potassium: 3.6 mmol/L (ref 3.5–5.1)
Sodium: 146 mmol/L — ABNORMAL HIGH (ref 135–145)
TCO2: 23 mmol/L (ref 22–32)

## 2019-06-23 LAB — BLOOD GAS, ARTERIAL
Acid-base deficit: 12.5 mmol/L — ABNORMAL HIGH (ref 0.0–2.0)
Bicarbonate: 18.2 mmol/L — ABNORMAL LOW (ref 20.0–28.0)
FIO2: 100
O2 Saturation: 99.1 %
Patient temperature: 98.6
pCO2 arterial: 74.2 mmHg (ref 32.0–48.0)
pH, Arterial: 7.019 — CL (ref 7.350–7.450)
pO2, Arterial: 305 mmHg — ABNORMAL HIGH (ref 83.0–108.0)

## 2019-06-23 LAB — MAGNESIUM: Magnesium: 1.6 mg/dL — ABNORMAL LOW (ref 1.7–2.4)

## 2019-06-23 LAB — PROTIME-INR
INR: 2.4 — ABNORMAL HIGH (ref 0.8–1.2)
Prothrombin Time: 26.5 seconds — ABNORMAL HIGH (ref 11.4–15.2)

## 2019-06-23 LAB — LACTIC ACID, PLASMA: Lactic Acid, Venous: >11 mmol/L (ref 0.5–1.9)

## 2019-06-23 LAB — MASSIVE TRANSFUSION PROTOCOL ORDER (BLOOD BANK NOTIFICATION)

## 2019-06-23 LAB — PREPARE RBC (CROSSMATCH)

## 2019-06-23 LAB — DIC (DISSEMINATED INTRAVASCULAR COAGULATION)PANEL
D-Dimer, Quant: >20 ug/mL-FEU — ABNORMAL HIGH (ref 0.00–0.50)
Fibrinogen: <60 mg/dL — CL (ref 210–475)
INR: 3.3 — ABNORMAL HIGH (ref 0.8–1.2)
Platelets: 50 10*3/uL — ABNORMAL LOW (ref 150–400)
Prothrombin Time: 33.6 seconds — ABNORMAL HIGH (ref 11.4–15.2)
Smear Review: NONE SEEN
aPTT: >200 seconds (ref 24–36)

## 2019-06-23 LAB — GLUCOSE, CAPILLARY: Glucose-Capillary: 142 mg/dL — ABNORMAL HIGH (ref 70–99)

## 2019-06-23 SURGERY — NEPHRECTOMY, HAND-ASSISTED, LAPAROSCOPIC
Anesthesia: General | Laterality: Right

## 2019-06-23 MED ORDER — PHENYLEPHRINE 40 MCG/ML (10ML) SYRINGE FOR IV PUSH (FOR BLOOD PRESSURE SUPPORT)
PREFILLED_SYRINGE | INTRAVENOUS | Status: DC | PRN
  Administered 2019-06-23 (×3): 120 ug via INTRAVENOUS
  Administered 2019-06-23: 40 ug via INTRAVENOUS
  Administered 2019-06-23: 120 ug via INTRAVENOUS

## 2019-06-23 MED ORDER — LACTATED RINGERS IV SOLN: INTRAVENOUS | Status: DC

## 2019-06-23 MED ORDER — ALBUMIN HUMAN 5 % IV SOLN: INTRAVENOUS | Status: DC | PRN

## 2019-06-23 MED ORDER — ROCURONIUM BROMIDE 10 MG/ML (PF) SYRINGE
PREFILLED_SYRINGE | INTRAVENOUS | Status: AC
  Filled 2019-06-23: qty 10

## 2019-06-23 MED ORDER — PROPOFOL 10 MG/ML IV BOLUS
INTRAVENOUS | Status: AC
  Filled 2019-06-23: qty 20

## 2019-06-23 MED ORDER — ACETAMINOPHEN 10 MG/ML IV SOLN
INTRAVENOUS | Status: DC | PRN
  Administered 2019-06-23: 1000 mg via INTRAVENOUS

## 2019-06-23 MED ORDER — LACTATED RINGERS IV SOLN: INTRAVENOUS | Status: DC | PRN

## 2019-06-23 MED ORDER — LIDOCAINE 2% (20 MG/ML) 5 ML SYRINGE
INTRAMUSCULAR | Status: DC | PRN
  Administered 2019-06-23: 1.5 mg/kg/h via INTRAVENOUS

## 2019-06-23 MED ORDER — EPINEPHRINE HCL 5 MG/250ML IV SOLN IN NS
INTRAVENOUS | Status: DC | PRN
  Administered 2019-06-23: 3 ug/min via INTRAVENOUS

## 2019-06-23 MED ORDER — EPINEPHRINE 1 MG/10ML IJ SOSY
PREFILLED_SYRINGE | INTRAMUSCULAR | Status: DC | PRN
  Administered 2019-06-23: .5 mg via INTRAVENOUS
  Administered 2019-06-23 (×4): 1 mg via INTRAVENOUS
  Administered 2019-06-23: .2 mg via INTRAVENOUS
  Administered 2019-06-23: 1.5 mg via INTRAVENOUS
  Administered 2019-06-23 (×5): 1 mg via INTRAVENOUS
  Administered 2019-06-23: .2 mg via INTRAVENOUS
  Administered 2019-06-23: .5 mg via INTRAVENOUS
  Administered 2019-06-23: .7 mg via INTRAVENOUS
  Administered 2019-06-23: 1 mg via INTRAVENOUS
  Administered 2019-06-23: .4 mg via INTRAVENOUS
  Administered 2019-06-23: 1 mg via INTRAVENOUS
  Administered 2019-06-23: .2 mg via INTRAVENOUS

## 2019-06-23 MED ORDER — LACTATED RINGERS IR SOLN
Status: DC | PRN
  Administered 2019-06-23: 1000 mL

## 2019-06-23 MED ORDER — LIDOCAINE 2% (20 MG/ML) 5 ML SYRINGE
INTRAMUSCULAR | Status: DC | PRN
  Administered 2019-06-23: 60 mg via INTRAVENOUS

## 2019-06-23 MED ORDER — VASOPRESSIN 20 UNIT/ML IV SOLN
INTRAVENOUS | Status: AC
  Filled 2019-06-23: qty 1

## 2019-06-23 MED ORDER — LIDOCAINE 2% (20 MG/ML) 5 ML SYRINGE
INTRAMUSCULAR | Status: AC
  Filled 2019-06-23: qty 5

## 2019-06-23 MED ORDER — FENTANYL CITRATE (PF) 100 MCG/2ML IJ SOLN
INTRAMUSCULAR | Status: DC | PRN
  Administered 2019-06-23: 50 ug via INTRAVENOUS

## 2019-06-23 MED ORDER — PANTOPRAZOLE SODIUM 40 MG IV SOLR: 40.0000 mg | INTRAVENOUS | Status: DC

## 2019-06-23 MED ORDER — CALCIUM CHLORIDE 10 % IV SOLN
INTRAVENOUS | Status: DC | PRN
  Administered 2019-06-23 (×2): 1 g via INTRAVENOUS

## 2019-06-23 MED ORDER — ROCURONIUM BROMIDE 10 MG/ML (PF) SYRINGE
PREFILLED_SYRINGE | INTRAVENOUS | Status: AC
  Filled 2019-06-23: qty 20

## 2019-06-23 MED ORDER — ONDANSETRON HCL 4 MG/2ML IJ SOLN
INTRAMUSCULAR | Status: AC
  Filled 2019-06-23: qty 2

## 2019-06-23 MED ORDER — PHENYLEPHRINE HCL-NACL 10-0.9 MG/250ML-% IV SOLN: 0.0000 ug/min | INTRAVENOUS | Status: DC

## 2019-06-23 MED ORDER — MIDAZOLAM HCL 5 MG/5ML IJ SOLN
INTRAMUSCULAR | Status: DC | PRN
  Administered 2019-06-23 (×2): 1 mg via INTRAVENOUS

## 2019-06-23 MED ORDER — PHENYLEPHRINE HCL-NACL 10-0.9 MG/250ML-% IV SOLN
INTRAVENOUS | Status: DC | PRN
  Administered 2019-06-23: 50 ug/min via INTRAVENOUS
  Administered 2019-06-23: 35 ug/min via INTRAVENOUS

## 2019-06-23 MED ORDER — LIDOCAINE HCL 2 % IJ SOLN
INTRAMUSCULAR | Status: AC
  Filled 2019-06-23: qty 20

## 2019-06-23 MED ORDER — FENTANYL CITRATE (PF) 100 MCG/2ML IJ SOLN
INTRAMUSCULAR | Status: AC
  Filled 2019-06-23: qty 2

## 2019-06-23 MED ORDER — NOREPINEPHRINE 4 MG/250ML-% IV SOLN
0.0000 ug/min | INTRAVENOUS | Status: DC
  Filled 2019-06-23 (×3): qty 250

## 2019-06-23 MED ORDER — STERILE WATER FOR IRRIGATION IR SOLN
Status: DC | PRN
  Administered 2019-06-23: 1000 mL

## 2019-06-23 MED ORDER — ROCURONIUM BROMIDE 10 MG/ML (PF) SYRINGE
PREFILLED_SYRINGE | INTRAVENOUS | Status: DC | PRN
Start: 2019-06-23 — End: 2019-06-23
  Administered 2019-06-23: 15 mg via INTRAVENOUS
  Administered 2019-06-23: 20 mg via INTRAVENOUS
  Administered 2019-06-23: 30 mg via INTRAVENOUS
  Administered 2019-06-23: 10 mg via INTRAVENOUS
  Administered 2019-06-23: 40 mg via INTRAVENOUS
  Administered 2019-06-23: 50 mg via INTRAVENOUS

## 2019-06-23 MED ORDER — VASOPRESSIN 20 UNIT/ML IV SOLN
INTRAVENOUS | Status: DC | PRN
  Administered 2019-06-23: 18 [IU] via INTRAVENOUS
  Administered 2019-06-23: 20 [IU] via INTRAVENOUS
  Administered 2019-06-23: 1 [IU] via INTRAVENOUS

## 2019-06-23 MED ORDER — DEXAMETHASONE SODIUM PHOSPHATE 10 MG/ML IJ SOLN
INTRAMUSCULAR | Status: AC
  Filled 2019-06-23: qty 1

## 2019-06-23 MED ORDER — BUPIVACAINE LIPOSOME 1.3 % IJ SUSP
20.0000 mL | Freq: Once | INTRAMUSCULAR | Status: DC
  Filled 2019-06-23: qty 20

## 2019-06-23 MED ORDER — ACETAMINOPHEN 10 MG/ML IV SOLN
INTRAVENOUS | Status: AC
  Filled 2019-06-23: qty 100

## 2019-06-23 MED ORDER — EPHEDRINE SULFATE-NACL 50-0.9 MG/10ML-% IV SOSY
PREFILLED_SYRINGE | INTRAVENOUS | Status: DC | PRN
  Administered 2019-06-23: 10 mg via INTRAVENOUS
  Administered 2019-06-23: 5 mg via INTRAVENOUS
  Administered 2019-06-23: 15 mg via INTRAVENOUS

## 2019-06-23 MED ORDER — DEXAMETHASONE SODIUM PHOSPHATE 10 MG/ML IJ SOLN
INTRAMUSCULAR | Status: DC | PRN
  Administered 2019-06-23: 8 mg via INTRAVENOUS

## 2019-06-23 MED ORDER — SODIUM BICARBONATE 8.4 % IV SOLN
INTRAVENOUS | Status: DC | PRN
  Administered 2019-06-23 (×2): 50 meq via INTRAVENOUS
  Administered 2019-06-23: 100 meq via INTRAVENOUS
  Administered 2019-06-23 (×4): 50 meq via INTRAVENOUS

## 2019-06-23 MED ORDER — SODIUM CHLORIDE (PF) 0.9 % IJ SOLN
INTRAMUSCULAR | Status: AC
  Filled 2019-06-23: qty 20

## 2019-06-23 MED ORDER — MIDAZOLAM HCL 2 MG/2ML IJ SOLN
INTRAMUSCULAR | Status: AC
  Filled 2019-06-23: qty 2

## 2019-06-23 MED ORDER — ATROPINE SULFATE 1 MG/ML IJ SOLN
INTRAMUSCULAR | Status: DC | PRN
  Administered 2019-06-23: 1 mg via INTRAVENOUS

## 2019-06-23 MED ORDER — CEFAZOLIN SODIUM-DEXTROSE 2-4 GM/100ML-% IV SOLN
2.0000 g | INTRAVENOUS | Status: AC
  Administered 2019-06-23: 13:00:00 2 g via INTRAVENOUS
  Filled 2019-06-23: qty 100

## 2019-06-23 MED ORDER — SODIUM CHLORIDE 0.9 % IV SOLN: INTRAVENOUS | Status: DC | PRN

## 2019-06-23 MED ORDER — NOREPINEPHRINE 4 MG/250ML-% IV SOLN
INTRAVENOUS | Status: DC | PRN
  Administered 2019-06-23: 10 ug/min via INTRAVENOUS

## 2019-06-23 MED ORDER — EPINEPHRINE HCL 5 MG/250ML IV SOLN IN NS
0.5000 ug/min | INTRAVENOUS | Status: DC
  Filled 2019-06-23: qty 250

## 2019-06-23 MED ORDER — VASOPRESSIN 20 UNIT/ML IV SOLN
INTRAVENOUS | Status: DC | PRN
  Administered 2019-06-23: 15:00:00 .007 [IU]/min via INTRAVENOUS

## 2019-06-23 MED ORDER — PROPOFOL 10 MG/ML IV BOLUS
INTRAVENOUS | Status: DC | PRN
  Administered 2019-06-23: 100 mg via INTRAVENOUS

## 2019-06-23 MED ORDER — PHENYLEPHRINE HCL (PRESSORS) 10 MG/ML IV SOLN
INTRAVENOUS | Status: AC
  Filled 2019-06-23: qty 1

## 2019-06-23 SURGICAL SUPPLY — 67 items
BAG LAPAROSCOPIC 12 15 PORT 16 (BASKET) ×1 IMPLANT
BAG RETRIEVAL 12/15 (BASKET) ×2
BAG RETRIEVAL 12/15MM (BASKET) ×1
BAG ZIPLOCK 12X15 (MISCELLANEOUS) IMPLANT
BLADE EXTENDED COATED 6.5IN (ELECTRODE) IMPLANT
BLADE SURG SZ10 CARB STEEL (BLADE) ×3 IMPLANT
CABLE HIGH FREQUENCY MONO STRZ (ELECTRODE) ×3 IMPLANT
CHLORAPREP W/TINT 26 (MISCELLANEOUS) ×3 IMPLANT
CLEANER TIP ELECTROSURG 2X2 (MISCELLANEOUS) IMPLANT
CLIP VESOLOCK LG 6/CT PURPLE (CLIP) ×3 IMPLANT
CLIP VESOLOCK MED LG 6/CT (CLIP) ×3 IMPLANT
CLIP VESOLOCK XL 6/CT (CLIP) ×3 IMPLANT
COVER SURGICAL LIGHT HANDLE (MISCELLANEOUS) ×3 IMPLANT
COVER WAND RF STERILE (DRAPES) IMPLANT
CUTTER FLEX LINEAR 45M (STAPLE) ×3 IMPLANT
DERMABOND ADVANCED (GAUZE/BANDAGES/DRESSINGS)
DERMABOND ADVANCED .7 DNX12 (GAUZE/BANDAGES/DRESSINGS) IMPLANT
DRAIN CHANNEL 10F 3/8 F FF (DRAIN) IMPLANT
DRAPE INCISE IOBAN 66X45 STRL (DRAPES) ×6 IMPLANT
DRSG TEGADERM 4X4.75 (GAUZE/BANDAGES/DRESSINGS) IMPLANT
ELECT PENCIL ROCKER SW 15FT (MISCELLANEOUS) ×3 IMPLANT
ELECT REM PT RETURN 15FT ADLT (MISCELLANEOUS) ×3 IMPLANT
EVACUATOR SILICONE 100CC (DRAIN) IMPLANT
GAUZE SPONGE 4X4 12PLY STRL (GAUZE/BANDAGES/DRESSINGS) ×3 IMPLANT
GLOVE BIO SURGEON STRL SZ 6.5 (GLOVE) ×4 IMPLANT
GLOVE BIO SURGEON STRL SZ7.5 (GLOVE) ×3 IMPLANT
GLOVE BIO SURGEONS STRL SZ 6.5 (GLOVE) ×2
GLOVE BIOGEL PI IND STRL 8 (GLOVE) ×1 IMPLANT
GLOVE BIOGEL PI INDICATOR 8 (GLOVE) ×2
GOWN STRL REUS W/TWL LRG LVL3 (GOWN DISPOSABLE) ×6 IMPLANT
GOWN STRL REUS W/TWL XL LVL3 (GOWN DISPOSABLE) ×3 IMPLANT
HEMOSTAT SURGICEL 4X8 (HEMOSTASIS) ×3 IMPLANT
INSERT FOGARTY SM (MISCELLANEOUS) ×3 IMPLANT
IRRIG SUCT STRYKERFLOW 2 WTIP (MISCELLANEOUS) ×3
IRRIGATION SUCT STRKRFLW 2 WTP (MISCELLANEOUS) ×1 IMPLANT
KIT BASIN OR (CUSTOM PROCEDURE TRAY) ×3 IMPLANT
KIT TURNOVER KIT A (KITS) IMPLANT
LIGASURE VESSEL 5MM BLUNT TIP (ELECTROSURGICAL) ×3 IMPLANT
MARKER SKIN DUAL TIP RULER LAB (MISCELLANEOUS) ×3 IMPLANT
PORT ACCESS TROCAR AIRSEAL 12 (TROCAR) ×1 IMPLANT
PORT ACCESS TROCAR AIRSEAL 5M (TROCAR) ×2
PORT LAP GEL ALEXIS MED 5-9CM (MISCELLANEOUS) ×3 IMPLANT
POUCH SPECIMEN RETRIEVAL 10MM (ENDOMECHANICALS) IMPLANT
RELOAD 45 VASCULAR/THIN (ENDOMECHANICALS) ×6 IMPLANT
SCISSORS LAP 5X35 DISP (ENDOMECHANICALS) IMPLANT
SET TUBE SMOKE EVAC HIGH FLOW (TUBING) ×3 IMPLANT
SPONGE LAP 18X18 RF (DISPOSABLE) ×12 IMPLANT
STAPLER VISISTAT 35W (STAPLE) IMPLANT
SURGIFLO W/THROMBIN 8M KIT (HEMOSTASIS) IMPLANT
SUT ETHILON 3 0 PS 1 (SUTURE) IMPLANT
SUT MNCRL AB 4-0 PS2 18 (SUTURE) IMPLANT
SUT PDS AB 1 TP1 96 (SUTURE) ×6 IMPLANT
SUT PROLENE 5 0 CC 1 (SUTURE) ×3 IMPLANT
SUT VIC AB 2-0 SH 27 (SUTURE)
SUT VIC AB 2-0 SH 27X BRD (SUTURE) IMPLANT
SUT VICRYL 0 UR6 27IN ABS (SUTURE) ×3 IMPLANT
SYS LAPSCP GELPORT 120MM (MISCELLANEOUS) ×3
SYSTEM LAPSCP GELPORT 120MM (MISCELLANEOUS) ×1 IMPLANT
TAPE CLOTH SURG 6X10 WHT LF (GAUZE/BANDAGES/DRESSINGS) ×3 IMPLANT
TOWEL OR 17X26 10 PK STRL BLUE (TOWEL DISPOSABLE) ×6 IMPLANT
TRAY FOLEY MTR SLVR 16FR STAT (SET/KITS/TRAYS/PACK) ×3 IMPLANT
TRAY LAPAROSCOPIC (CUSTOM PROCEDURE TRAY) ×3 IMPLANT
TROCAR BLADELESS OPT 5 100 (ENDOMECHANICALS) ×3 IMPLANT
TROCAR PORT AIRSEAL 5X120 (TROCAR) ×3 IMPLANT
TROCAR UNIVERSAL OPT 12M 100M (ENDOMECHANICALS) ×3 IMPLANT
TROCAR XCEL 12X100 BLDLESS (ENDOMECHANICALS) ×3 IMPLANT
YANKAUER SUCT BULB TIP 10FT TU (MISCELLANEOUS) ×3 IMPLANT

## 2019-06-25 LAB — PREPARE FRESH FROZEN PLASMA
Unit division: 0
Unit division: 0
Unit division: 0
Unit division: 0
Unit division: 0
Unit division: 0
Unit division: 0
Unit division: 0
Unit division: 0
Unit division: 0
Unit division: 0
Unit division: 0
Unit division: 0
Unit division: 0
Unit division: 0

## 2019-06-25 LAB — BPAM FFP
Blood Product Expiration Date: 202102132359
Blood Product Expiration Date: 202102132359
Blood Product Expiration Date: 202102172359
Blood Product Expiration Date: 202102172359
Blood Product Expiration Date: 202102172359
Blood Product Expiration Date: 202102172359
Blood Product Expiration Date: 202102172359
Blood Product Expiration Date: 202102172359
Blood Product Expiration Date: 202102172359
Blood Product Expiration Date: 202102172359
Blood Product Expiration Date: 202102172359
Blood Product Expiration Date: 202102172359
Blood Product Expiration Date: 202102172359
Blood Product Expiration Date: 202103062359
Blood Product Expiration Date: 202103062359
ISSUE DATE / TIME: 202102121417
ISSUE DATE / TIME: 202102121417
ISSUE DATE / TIME: 202102121430
ISSUE DATE / TIME: 202102121430
ISSUE DATE / TIME: 202102121439
ISSUE DATE / TIME: 202102121439
ISSUE DATE / TIME: 202102121449
ISSUE DATE / TIME: 202102121449
ISSUE DATE / TIME: 202102121456
ISSUE DATE / TIME: 202102121555
Unit Type and Rh: 600
Unit Type and Rh: 6200
Unit Type and Rh: 6200
Unit Type and Rh: 6200
Unit Type and Rh: 6200
Unit Type and Rh: 6200
Unit Type and Rh: 6200
Unit Type and Rh: 6200
Unit Type and Rh: 6200
Unit Type and Rh: 6200
Unit Type and Rh: 6200
Unit Type and Rh: 6200
Unit Type and Rh: 6200
Unit Type and Rh: 6200
Unit Type and Rh: 6200

## 2019-06-25 LAB — BPAM RBC
Blood Product Expiration Date: 202103132359
Blood Product Expiration Date: 202103132359
Blood Product Expiration Date: 202103132359
Blood Product Expiration Date: 202103132359
Blood Product Expiration Date: 202103162359
Blood Product Expiration Date: 202103162359
Blood Product Expiration Date: 202103162359
Blood Product Expiration Date: 202103162359
Blood Product Expiration Date: 202103162359
Blood Product Expiration Date: 202103172359
Blood Product Expiration Date: 202103172359
Blood Product Expiration Date: 202103172359
Blood Product Expiration Date: 202103172359
Blood Product Expiration Date: 202103172359
Blood Product Expiration Date: 202103202359
Blood Product Expiration Date: 202103202359
Blood Product Expiration Date: 202103202359
Blood Product Expiration Date: 202103202359
ISSUE DATE / TIME: 202102121409
ISSUE DATE / TIME: 202102121409
ISSUE DATE / TIME: 202102121425
ISSUE DATE / TIME: 202102121425
ISSUE DATE / TIME: 202102121432
ISSUE DATE / TIME: 202102121432
ISSUE DATE / TIME: 202102121435
ISSUE DATE / TIME: 202102121435
ISSUE DATE / TIME: 202102121443
ISSUE DATE / TIME: 202102121443
ISSUE DATE / TIME: 202102121449
ISSUE DATE / TIME: 202102121455
ISSUE DATE / TIME: 202102121455
ISSUE DATE / TIME: 202102130454
ISSUE DATE / TIME: 202102130928
Unit Type and Rh: 6200
Unit Type and Rh: 6200
Unit Type and Rh: 6200
Unit Type and Rh: 6200
Unit Type and Rh: 6200
Unit Type and Rh: 6200
Unit Type and Rh: 6200
Unit Type and Rh: 6200
Unit Type and Rh: 6200
Unit Type and Rh: 6200
Unit Type and Rh: 6200
Unit Type and Rh: 6200
Unit Type and Rh: 6200
Unit Type and Rh: 6200
Unit Type and Rh: 6200
Unit Type and Rh: 6200
Unit Type and Rh: 6200
Unit Type and Rh: 6200

## 2019-06-25 LAB — PREPARE PLATELET PHERESIS
Unit division: 0
Unit division: 0
Unit division: 0

## 2019-06-25 LAB — PREPARE CRYOPRECIPITATE: Unit division: 0

## 2019-06-25 LAB — TYPE AND SCREEN
ABO/RH(D): A POS
Antibody Screen: NEGATIVE
Unit division: 0
Unit division: 0
Unit division: 0
Unit division: 0
Unit division: 0
Unit division: 0
Unit division: 0
Unit division: 0
Unit division: 0
Unit division: 0
Unit division: 0
Unit division: 0
Unit division: 0
Unit division: 0
Unit division: 0
Unit division: 0
Unit division: 0
Unit division: 0

## 2019-06-25 LAB — BPAM PLATELET PHERESIS
Blood Product Expiration Date: 202102142359
Blood Product Expiration Date: 202102142359
Blood Product Expiration Date: 202102142359
ISSUE DATE / TIME: 202102121430
ISSUE DATE / TIME: 202102121431
Unit Type and Rh: 2800
Unit Type and Rh: 6200
Unit Type and Rh: 6200

## 2019-06-25 LAB — BPAM CRYOPRECIPITATE
Blood Product Expiration Date: 202102122109
ISSUE DATE / TIME: 202102121524
Unit Type and Rh: 6200

## 2019-06-26 ENCOUNTER — Encounter: Payer: Self-pay | Admitting: *Deleted

## 2019-06-26 LAB — SURGICAL PATHOLOGY

## 2019-07-10 NOTE — Procedures (Signed)
Extubation Procedure Note  Patient Details:   Name: Mark Burgess DOB: 1940/12/23 MRN: [id number]   Airway Documentation:    Vent end date: Jul 12, 2019 Vent end time: 1735   Evaluation  O2 sats: stable throughout Complications: No apparent complications Patient did tolerate procedure well. Bilateral Breath Sounds: Diminished   No  Johnette Abraham 2019/07/12, 5:36 PM

## 2019-07-10 NOTE — Transfer of Care (Signed)
Immediate Anesthesia Transfer of Care Note  Patient: Mark Burgess  Procedure(s) Performed: RIGHT HAND ASSISTED LAPAROSCOPIC NEPHRECTOMY (Right )  Patient Location: ICU  Anesthesia Type:General  Level of Consciousness: Patient remains intubated per anesthesia plan  Airway & Oxygen Therapy: Patient remains intubated per anesthesia plan  Post-op Assessment: Report given to RN  Post vital signs: Reviewed  Last Vitals:  Vitals Value Taken Time  BP    Temp    Pulse 91 July 17, 2019 1708  Resp    SpO2 85 % 07-17-19 1708  Vitals shown include unvalidated device data.  Last Pain:  Vitals:   07-17-2019 1102  TempSrc:   PainSc: 0-No pain         Complications: Remains intubated

## 2019-07-10 NOTE — Death Summary Note (Addendum)
Death confirmed by two RNs bedside. 1804 confirmed. ME case pending. CDS notified, potential eye donor. No need to prep eyes due to ME possibility.  Funeral home arrangements to be made.

## 2019-07-10 NOTE — Progress Notes (Signed)
Pt received from the OR intubated.

## 2019-07-10 NOTE — Anesthesia Procedure Notes (Signed)
Procedure Name: Intubation Date/Time: 07/10/2019 12:37 PM Performed by: Niel Hummer, CRNA Pre-anesthesia Checklist: Patient identified, Emergency Drugs available, Suction available and Patient being monitored Patient Re-evaluated:Patient Re-evaluated prior to induction Oxygen Delivery Method: Circle system utilized Preoxygenation: Pre-oxygenation with 100% oxygen Induction Type: IV induction Ventilation: Oral airway inserted - appropriate to patient size and Mask ventilation without difficulty Laryngoscope Size: Mac and 4 Grade View: Grade II Tube type: Oral Tube size: 7.5 mm Number of attempts: 1 Airway Equipment and Method: Stylet Placement Confirmation: ETT inserted through vocal cords under direct vision,  positive ETCO2 and breath sounds checked- equal and bilateral Secured at: 23 cm Tube secured with: Tape Dental Injury: Teeth and Oropharynx as per pre-operative assessment

## 2019-07-10 NOTE — Progress Notes (Addendum)
Patient arrives to ICU in critical condition receiving blood transfusion. Bleeding noted from right port site, hemorrhaging through dressing. Urology MD called to bedside to see patient. Family discussed with CCM physician and decided to transition to comfort care, requesting the patient be extubated. Vasopressor medications discontinued after extubation. Will continue to monitor.

## 2019-07-10 NOTE — Discharge Summary (Deleted)
DEATH SUMMARY   Patient Details  Name: Mark Burgess MRN: [id number] DOB: June 13, 1940  Admission/Discharge Information   Admit Date:  12-Jul-2019  Date of Death:    Time of Death:    Length of Stay: 0  Referring Physician: Kirk Ruths, MD   Reason(s) for Hospitalization   right renal mass  Diagnoses  Preliminary cause of death: Cardiac arrest Secondary Diagnoses (including complications and co-morbidities):  Active Problems:   Renal mass   Brief Hospital Course (including significant findings, care, treatment, and services provided and events leading to death)  Mark Burgess is a 79 y.o. year old male who  Who presented with a right renal mass concerning for renal cell carcinoma.  He also had a lung lesion.  This was thought to be possibly benign and therefore after oncological evaluation,  Decision was made to proceed with nephrectomy.  In operating room, during his laparoscopic nephrectomy, bleeding was encountered.  I was able to remove the kidney and to apply pressure on the bleeding.  At this point in time, the patient had PEA arrest.  Chest compressions were started.  Despite massive blood transfusion protocol, pressors, and resuscitation, the patient was able to regain poles was transferred to the ICU where he was subsequently evaluated and thought to be brain dead.  Family was notified and elected to withdraw care.  Patient subsequently died after withdrawal of care.    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 Enteric catheter passes below diaphragm tip excluded by collimation. There is a right internal jugular  central venous catheter tip overlying superior vena cava. Mild diffuse interstitial prominence may reflect interstitial edema. Likely hypoventilatory changes at the left lung base. No airspace disease, effusion, or pneumothorax. IMPRESSION: 1. Support devices as above. 2. Mild interstitial edema. Electronically Signed   By: Randa Ngo M.D.   On: 2019-07-12 17:08    Microbiology Recent Results (from the past 240 hour(s))  SARS CORONAVIRUS 2 (TAT 6-24 HRS) Nasopharyngeal Nasopharyngeal Swab     Status: None   Collection Time: 06/20/19 11:01 AM   Specimen: Nasopharyngeal Swab  Result Value Ref Range Status   SARS Coronavirus 2 NEGATIVE NEGATIVE Final    Comment: (NOTE) SARS-CoV-2 target nucleic acids are NOT DETECTED. The SARS-CoV-2 RNA is generally detectable in upper and lower respiratory specimens during the acute phase of infection. Negative results do not preclude SARS-CoV-2 infection, do not rule out co-infections with other pathogens, and should not be used as the sole basis for treatment or other patient management decisions. Negative results must be combined with clinical observations, patient history, and epidemiological information. The expected result is Negative. Fact Sheet for Patients: SugarRoll.be Fact Sheet for Healthcare Providers: https://www.woods-mathews.com/ This test is not yet approved or cleared by the [place] FDA and  has been authorized for detection and/or diagnosis of SARS-CoV-2 by FDA under an Emergency Use Authorization (EUA). This EUA will remain  in effect (meaning this test can be used) for the duration of the COVID-19 declaration under Section 56 4(b)(1) of the Act, 21 U.S.C. section 360bbb-3(b)(1), unless the authorization is terminated or revoked sooner. Performed at [facility], [place] [address] [place], [place] [address]  Lab Basic Metabolic Panel: Recent Labs  Lab 07/16/2019 1607  07-16-19 1618  NA 146* 146*  K 3.6 3.6  CL 102  --   GLUCOSE 333*  --   BUN 34*  --   CREATININE 1.50*  --    Liver Function Tests: No results for input(s): AST, ALT, ALKPHOS, BILITOT, PROT, ALBUMIN in the last 168 hours. No results for input(s): LIPASE, AMYLASE in the last 168 hours. No results for input(s): AMMONIA in the last 168 hours. CBC: Recent Labs  Lab 07/16/19 1604 07-16-2019 1607 2019/07/16 1618  HGB  --  7.5* 6.8*  HCT  --  22.0* 20.0*  PLT 50*  --   --    Cardiac Enzymes: No results for input(s): CKTOTAL, CKMB, CKMBINDEX, TROPONINI in the last 168 hours. Sepsis Labs: No results for input(s): PROCALCITON, WBC, LATICACIDVEN in the last 168 hours.  Procedures/Operations    Laparoscopic radical nephrectomy on the right   Marton Redwood, III Jul 16, 2019, 5:39 PM

## 2019-07-10 NOTE — Anesthesia Procedure Notes (Signed)
Arterial Line Insertion Start/End2021/03/12 2:00 PM, 07-21-19 2:05 PM Performed by: Suzette Battiest, MD  Patient location: Pre-op. Preanesthetic checklist: patient identified, IV checked, site marked, risks and benefits discussed, surgical consent, monitors and equipment checked, pre-op evaluation, timeout performed and anesthesia consent Lidocaine 1% used for infiltration Left, radial was placed Catheter size: 20 Fr Hand hygiene performed  and maximum sterile barriers used   Attempts: 2 Procedure performed without using ultrasound guided technique. Following insertion, dressing applied. Post procedure assessment: normal and unchanged

## 2019-07-10 NOTE — Anesthesia Postprocedure Evaluation (Signed)
Anesthesia Post Note  Patient: Mark Burgess  Procedure(s) Performed: RIGHT HAND ASSISTED LAPAROSCOPIC NEPHRECTOMY (Right )     Patient location during evaluation: SICU Anesthesia Type: General Level of consciousness: comatose Vital Signs Assessment: vitals unstable Respiratory status: patient on ventilator - see flowsheet for VS Cardiovascular status: unstable Anesthetic complications: no Comments: Pt to be made comfort care per family's wishes.     Last Vitals:  Vitals:   July 07, 2019 1041  BP: 140/70  Pulse: 66  Resp: 16  Temp: 36.7 C  SpO2: 96%    Last Pain:  Vitals:   07-07-2019 1102  TempSrc:   PainSc: 0-No pain                 Tiajuana Amass

## 2019-07-10 NOTE — Discharge Summary (Signed)
DEATH SUMMARY   Patient Details  Name: Mark Burgess MRN: [id number] DOB: 02-10-1941  Admission/Discharge Information   Admit Date:  11-Jul-2019  Date of Death:    Time of Death:    Length of Stay: 0  Referring Physician: Kirk Ruths, MD   Reason(s) for Hospitalization   right renal mass  Diagnoses  Preliminary cause of death: Cardiac arrest Secondary Diagnoses (including complications and co-morbidities):  Active Problems:   Renal mass   Brief Hospital Course (including significant findings, care, treatment, and services provided and events leading to death)  Mark Burgess is a 79 y.o. year old male who  Who presented with a right renal mass concerning for renal cell carcinoma.  He also had a lung lesion.  This was thought to be possibly benign and therefore after oncological evaluation,  Decision was made to proceed with nephrectomy.  In operating room, during his laparoscopic nephrectomy, bleeding was encountered.  I was able to remove the kidney and to apply pressure on the bleeding.  At this point in time, the patient had PEA arrest.  Chest compressions were started.  Despite massive blood transfusion protocol, pressors, and resuscitation, the patient was able to regain poles was transferred to the ICU where he was subsequently evaluated and thought to be brain dead.  Family was notified and elected to withdraw care.  Patient subsequently died after withdrawal of care.Time of death was 6:04 p.m.    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 Enteric catheter passes below diaphragm tip excluded by collimation. There is  a right internal jugular central venous catheter tip overlying superior vena cava. Mild diffuse interstitial prominence may reflect interstitial edema. Likely hypoventilatory changes at the left lung base. No airspace disease, effusion, or pneumothorax. IMPRESSION: 1. Support devices as above. 2. Mild interstitial edema. Electronically Signed   By: Randa Ngo M.D.   On: 07/11/19 17:08    Microbiology Recent Results (from the past 240 hour(s))  SARS CORONAVIRUS 2 (TAT 6-24 HRS) Nasopharyngeal Nasopharyngeal Swab     Status: None   Collection Time: 06/20/19 11:01 AM   Specimen: Nasopharyngeal Swab  Result Value Ref Range Status   SARS Coronavirus 2 NEGATIVE NEGATIVE Final    Comment: (NOTE) SARS-CoV-2 target nucleic acids are NOT DETECTED. The SARS-CoV-2 RNA is generally detectable in upper and lower respiratory specimens during the acute phase of infection. Negative results do not preclude SARS-CoV-2 infection, do not rule out co-infections with other pathogens, and should not be used as the sole basis for treatment or other patient management decisions. Negative results must be combined with clinical observations, patient history, and epidemiological information. The expected result is Negative. Fact Sheet for Patients: SugarRoll.be Fact Sheet for Healthcare Providers: https://www.woods-mathews.com/ This test is not yet approved or cleared by the [place] FDA and  has been authorized for detection and/or diagnosis of SARS-CoV-2 by FDA under an Emergency Use Authorization (EUA). This EUA will remain  in effect (meaning this test can be used) for the duration of the COVID-19 declaration under Section 56 4(b)(1) of the Act, 21 U.S.C. section 360bbb-3(b)(1), unless the authorization is terminated or revoked sooner. Performed at [facility], [place]  [address], [place], [place] [address]     Lab Basic Metabolic Panel: Recent Labs   Lab 2019/06/27 1607 06-27-19 1618  NA 146* 146*  K 3.6 3.6  CL 102  --   GLUCOSE 333*  --   BUN 34*  --   CREATININE 1.50*  --    Liver Function Tests: No results for input(s): AST, ALT, ALKPHOS, BILITOT, PROT, ALBUMIN in the last 168 hours. No results for input(s): LIPASE, AMYLASE in the last 168 hours. No results for input(s): AMMONIA in the last 168 hours. CBC: Recent Labs  Lab 2019/06/27 1604 06-27-19 1607 06-27-19 1618  HGB  --  7.5* 6.8*  HCT  --  22.0* 20.0*  PLT 50*  --   --    Cardiac Enzymes: No results for input(s): CKTOTAL, CKMB, CKMBINDEX, TROPONINI in the last 168 hours. Sepsis Labs: No results for input(s): PROCALCITON, WBC, LATICACIDVEN in the last 168 hours.  Procedures/Operations    Laparoscopic radical nephrectomy on the right   Marton Redwood, III 06/27/2019, 5:46 PM

## 2019-07-10 NOTE — Op Note (Signed)
Operative Note  Preoperative diagnosis:  1.  Right renal mass  Postoperative diagnosis: 1.  Right renal mass  Procedure(s): 1.  Right hand assisted laparoscopic radical nephrectomy  Surgeon: Link Snuffer, MD  Assistants: Essie Christine assistant was needed throughout the case further expertise in assisting with a laparoscopic surgery, including visualization with the camera, passing instruments, etc.  Anesthesia: General  Complications: Bleeding requiring blood transfusion, hypotension and subsequent pulseless electrical activity requiring chest compressions and resuscitation.  See below for details  EBL: 4000 cc  Specimens: 1.  Right kidney  Drains/Catheters: 1. Foley catheter  Intraoperative findings: Right kidney removed entirely.  Across the staple line at the renal hilum, there was brisk bleeding from the renal artery at the actual staple line.  This was able to have a clip put across it.  Indication: 79 year old male who was found on imaging to have a right renal mass concerning for renal cell carcinoma.  After discussion of different options, the patient elected to undergo the above operation.  Description of procedure:  The patient was identified and consent was obtained.  The patient was taken to the operating room and placed in the supine position.  The patient was placed under general anesthesia.  Perioperative antibiotics were administered.  The patient was placed in right lateral position at approximately 65 degrees and all pressure points were padded.  Patient was prepped and draped in a standard sterile fashion and a timeout was performed.  An 8 cm periumbilical incision was made sharply into the skin.  This was carried down with Bovie electrocautery down to the anterior rectus sheath which was divided with electrocautery.  The underlying musculature was separated in the midline.  Sharp dissection with Metzenbaum scissors was used to open up the posterior sheath  and peritoneum.  This was extended with electrocautery taking great care not to use cautery near the bowel.  The hand assist port was secured into the incision.  I made sure no bowel was trapped within this.  A 12 mm port was inserted through the hand assist port and the abdomen was insufflated to a pressure of 15.  A 12 mm port was placed lateral as well as superior to the hand assist port, each 1 about a hand width away. A 5 mm port was placed superior to the midline port and used for liver retraction.  Please note that all ports were placed under direct visualization with the camera.  The colon was first dissected medially by incising along the white line of Toldt.  After medializing the colon, the kidney was dissected laterally and medially as well as superiorly.  Inferior attachments as well as the ureter and gonadal vein were divided with LigaSure device. I continued to carefully dissect medially and identified the renal hilum.  The renal vein and renal artery were divided with a 45 mm vascular staple load.  Superior attachments were then released using LigaSure device as well as blunt dissection.  Once the entire kidney and surrounding Gerota's fascia was freed, the specimen was withdrawn from the midline incision and passed off for permanent specimen.  At this point in time, there was noted to be brisk bleeding from the nephrectomy bed.  It appeared that the bleeding was coming from the renal hilum.  I applied pressure to the bleeding and increased pneumothorax to 20.  The bleeding stopped.  At this point, I called one of my partners, Alexis Frock, who came in the room to help me.  At this  same point in time, the patient's pressures dropped and he had pulseless electrical activity.  At this point in time, chest compressions were initiated.  Massive blood transfusion protocol was ordered.  During chest compressions, pneumoperitoneum was decreased to see if this helped with cardiac return.  I maintained  continuous visualization of the nephrectomy bed.  There was a lap on top of the bleeding and I removed pressure while we were doing chest compressions.  There was no buildup of additional bleeding.  At this point in time, I left to talk to the patient's wife.  Myself and Dr. Ola Spurr the anesthesiologist discussed his critical condition.  She informed us that he is a DNR.  Therefore, we elected to stop chest compressions at that time.  He still had pulseless electrical activity.  We maintained the massive transfusion protocol.  At this point, we decided to continue working to stop the area of bleeding.  The bleeding was coming from the renal artery stump that had been stapled.  A clip was placed across this and there was no active bleeding noted.  I placed a piece of Surgicel in the nephrectomy bed.  I decreased the pneumoperitoneum to 5 and there was no active bleeding noted.  I closed the midline incision was oh looped PDS suture.  The port incisions were closed with 2-0 Vicryl and then the incisions were closed with staples.  A dressing was applied.  At the conclusion of the case, the patient remained in critical condition.  A femoral line was placed and there was pressure and perfusion noted.  He was transferred to the ICU in critical condition.   Plan: He will remain in the ICU and hopefully his condition will improve.

## 2019-07-10 NOTE — H&P (Signed)
CC/HPI: CC: Right renal mass  HPI:  05/02/2019  79 year old male with a history nephrolithiasis status post right ureteroscopy and left PCNL. In this regards, he has been doing well. He was recently evaluated for a possible lung mass. Decision has been made just to follow this and repeat a CT scan in 3 months. However, he had a PET scan on 04/26/2019. This revealed a large 6.2 x 4.8 cm right lower pole exophytic mass concerning for possible renal cell carcinoma. The pulmonary nodule had low metabolic activity suggesting a benign etiology. There was no lymphadenopathy within the abdomen. He denies gross hematuria or dysuria. No unexplained weight loss. He does have a significant cardiac history. He has had multiple stents placed in the past, last time was about 1 year ago. He is on aspirin and Plavix.   05/26/2019  Patient underwent CT renal mass protocol. This revealed an enhancing 7.1 cm right lower kidney mass. It re-demonstrated a 13 mm left lower lobe nodule. He has not had any abdominal surgeries. He is on aspirin and Plavix.     ALLERGIES: No Allergies    MEDICATIONS: Advair  Allopurinol 300 mg tablet Oral  Amlodipine Besylate 5 mg tablet Oral  Aspirin 81 MG TABS Oral  Carvedilol 12.5 mg tablet Oral  Centrum Silver 0.4 mg-300 mcg-250 mcg tablet Oral  Clopidogrel 75 mg tablet  Finasteride 5 mg tablet 0 Oral  Latanoprost 0.005 % drops  Lisinopril-Hydrochlorothiazide 20 mg-12.5 mg tablet Oral  Metformin Hcl Er 500 mg tablet, extended release 24 hr Oral  Nitrostat 0.4 mg tablet, sublingual Sublingual  Pantoprazole Sodium 40 mg tablet, delayed release  Rosuvastatin Calcium 40 mg tablet     GU PSH: Cysto Remove Stent FB Sim - 07/26/2017, 04/30/2017 Cysto Uretero Lithotripsy, Right - 03/11/2017 Cystoscopy - 04/07/2017 Cystoscopy And Treatment, Right - 04/19/2017 ESWL - 02/25/2017 Locm 300-399Mg /Ml Iodine,1Ml - 05/23/2019 PCNL, Left - 07/12/2017 Placement of ureteral stent, percutaneous,  Left - 07/12/2017 Ureteroscopic laser litho, Right - 04/19/2017, Right - 03/11/2017       PSH Notes: Knee Surgery   NON-GU PSH: Appendectomy - 2009 Cardiac Stent Placement - 11/08/2017 Cataract surgery, Bilateral         GU PMH: History of urolithiasis (Stable) - 05/02/2019, Nephrolithiasis, - [id number] Right uncertain neoplasm of kidney - 05/02/2019 Renal calculus, Status post bilateral percutaneous procedures. I don't see evident stones present today - 06/08/2018, (Improving), Recurrent urolithiasis, status post right ES L, right ureteroscopy, left percutaneous nephrolithotomy in the past several months. Left renal ultrasound today shows 2 separate 3 mm areas, may be vascular calcifications versus small stones. Last 24-hour urine from 2017 revealed adequate urine volume, urine pH of 5, slight elevated oxalate. He is on allopurinol., - 08/18/2017, Large left lower pole stone, I think this needs treatment. It is growing. We have discussed percutaneous management. He does have remaining right renal calculi despite 2 procedures, - 06/14/2017, Bilateral, Status post right ureteroscopy 2 for right ureteral stones. Stent removed today. He does have a large left lower pole stone burden, - 04/30/2017 (Stable), Fairly stable renal calculi bilaterally, asymptomatic, - 2018 (Stable), They are basically the same size. He is doing his best to follow adequate dietary guidelines., - 2018 (Worsening), Bilateral nephrolithiasis, stones growing but asymptomatic still. He does have abnormalities of his 24-hour urine-increased uric acid/decreased pH, slight elevation of oxalate., - 2017 (Improving), the patient has bilateral renal calculi, quite large, Hounsfield units over 1000. He has passed his right ureteral stone based on KUB today., -  08-31-2015 Renal and ureteral calculus - 03/05/2017 Elevated PSA (Stable), BPH/elevated PSA, symptoms stable on finasteride. August 30, 2016, (Stable), - 30-Aug-2016, Elevated prostate specific antigen (PSA), -  08/31/15 Ureteral calculus - 08-31-15 BPH w/LUTS, Benign prostatic hyperplasia with lower urinary tract symptoms - August 31, 2015 ED due to arterial insufficiency, Erectile dysfunction due to arterial insufficiency - 08-31-15 BPH w/o LUTS, Benign prostatic hypertrophy without lower urinary tract symptoms - Aug 31, 2014 Personal Hx Oth male genital organs diseases, History of acute prostatitis - 2012/08/30 Proteinuria, Isolated proteinuria - 08/30/12 Urinary Retention, Unspec, Acute Urinary Retention - Aug 30, 2012      PMH Notes:  2008-03-01 09:50:37 - Note: Arthritis   NON-GU PMH: Encounter for general adult medical examination without abnormal findings, Encounter for preventive health examination - August 31, 2015 Gout, Gout - 30-Aug-2012 Personal history of other diseases of the circulatory system, History of hypertension - 2012/08/30 Personal history of other endocrine, nutritional and metabolic disease, History of hypercholesterolemia - 08-30-2012, History of diabetes mellitus, - August 30, 2012 Personal history of other infectious and parasitic diseases, History of hepatitis - 08-30-2012 Personal history of other specified conditions, History of heartburn - 08-30-12    FAMILY HISTORY: Death In The Family Father - Father Death In The Family Mother - Mother   SOCIAL HISTORY: Marital Status: Married Preferred Language: English; Ethnicity: Not Hispanic Or Latino; Race: White Current Smoking Status: Patient does not smoke anymore. Has not smoked since 11/09/1975.  Social Drinker.  Drinks 3 caffeinated drinks per day.     Notes: Former smoker, Occupation:, Alcohol Use, Caffeine Use, Marital History - Currently Married   REVIEW OF SYSTEMS:     GU Review Male:  Patient denies frequent urination, hard to postpone urination, burning/ pain with urination, get up at night to urinate, leakage of urine, stream starts and stops, trouble starting your stream, have to strain to urinate , erection problems, and penile pain.    Gastrointestinal (Upper):  Patient denies nausea, vomiting, and  indigestion/ heartburn.    Gastrointestinal (Lower):  Patient denies diarrhea and constipation.    Constitutional:  Patient denies fever, night sweats, weight loss, and fatigue.    Skin:  Patient denies skin rash/ lesion and itching.    Eyes:  Patient denies blurred vision and double vision.    Ears/ Nose/ Throat:  Patient denies sore throat and sinus problems.    Hematologic/Lymphatic:  Patient denies swollen glands and easy bruising.    Cardiovascular:  Patient denies chest pains and leg swelling.    Respiratory:  Patient denies cough and shortness of breath.    Endocrine:  Patient denies excessive thirst.    Musculoskeletal:  Patient denies back pain and joint pain.    Neurological:  Patient denies headaches and dizziness.    Psychologic:  Patient denies depression and anxiety.    VITAL SIGNS:       05/26/2019 07:53 AM     BP 149/70 mmHg     Heart Rate 76 /min     Temperature 97.5 F / 36.3 C     MULTI-SYSTEM PHYSICAL EXAMINATION:      Constitutional: Well-nourished. No physical deformities. Normally developed. Good grooming.     Respiratory: No labored breathing, no use of accessory muscles.      Cardiovascular: Normal temperature, adequate perfusion of extremities     Skin: No paleness, no jaundice     Neurologic / Psychiatric: Oriented to time, oriented to place, oriented to person. No depression, no anxiety, no agitation.     Gastrointestinal: Obese, reducible umbilical hernia  Eyes: Normal conjunctivae. Normal eyelids.     Musculoskeletal: Normal gait and station of head and neck.            PAST DATA REVIEWED:   Source Of History:  Patient  Records Review:  Previous Doctor Records, Previous Patient Records  X-Ray Review: C.T. Abdomen/Pelvis: Reviewed Films. Reviewed Report. Discussed With Patient.      09/14/15 08/27/14 08/22/13 08/15/12 07/24/11 07/22/10 10/14/09 04/15/09  PSA  Total PSA 2.28  2.85  3.02  7.04  6.37  7.39  9.67  8.56   Free PSA  0.67   2.03  2.02   2.1  2.50  1.35   % Free PSA  24   29  32  28  25.9  15.8     03/21/04  Hormones  Testosterone, Total 3.06     PROCEDURES:    Urinalysis w/Scope - 81001  Dipstick Dipstick Cont'd Micro  Color: Yellow Bilirubin: Neg WBC/hpf: 6 - 10/hpf  Appearance: Clear Ketones: Neg RBC/hpf: NS (Not Seen)  Specific Gravity: 1.025 Blood: Neg Bacteria: NS (Not Seen)  pH: 6.0 Protein: 3+ Cystals: NS (Not Seen)  Glucose: Neg Urobilinogen: 0.2 Casts: NS (Not Seen)   Nitrites: Neg Trichomonas: Not Present   Leukocyte Esterase: Neg Mucous: Not Present    Epithelial Cells: NS (Not Seen)    Yeast: NS (Not Seen)    Sperm: Not Present   Notes:       ASSESSMENT:     ICD-10 Details  1 GU:  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 Risks and benefits discussed including but not limited to bleeding, infection, injury to surrounding structures, need for additional procedures, need for blood transfusion.   I will discuss this in relation to his lung nodule and discuss this with his pulmonologist. May involve Oncology.   CC: Dr. Ouida Sills   Signed by Link Snuffer, III, M.D. on 05/29/19 at 4:49 PM (EST

## 2019-07-10 NOTE — Anesthesia Procedure Notes (Deleted)
Arterial Line Insertion Start/End02-15-2021 3:45 PM, 06/26/2019 3:52 PM Performed by: Suzette Battiest, MD, anesthesiologist  Patient location: OR. Preanesthetic checklist: patient identified, IV checked, site marked, risks and benefits discussed, surgical consent, monitors and equipment checked, pre-op evaluation, timeout performed and anesthesia consent Patient sedated femoral was placed Catheter size: 20 G Maximum sterile barriers used   Attempts: 1 Procedure performed using ultrasound guided technique. Ultrasound Notes:anatomy identified, needle tip was noted to be adjacent to the nerve/plexus identified, no ultrasound evidence of intravascular and/or intraneural injection and image(s) printed for medical record Following insertion, line sutured, dressing applied and Biopatch. Patient tolerated the procedure well with no immediate complications.

## 2019-07-10 NOTE — Discharge Instructions (Signed)

## 2019-07-10 NOTE — Anesthesia Preprocedure Evaluation (Signed)
Anesthesia Evaluation  Patient identified by MRN, date of birth, ID band Patient awake    Reviewed: Allergy & Precautions, NPO status , Patient's Chart, lab work & pertinent test results  Airway Mallampati: II  TM Distance: <3 FB Neck ROM: Full    Dental  (+) Dental Advisory Given   Pulmonary COPD, former smoker,    breath sounds clear to auscultation       Cardiovascular hypertension, Pt. on medications and Pt. on home beta blockers + angina + CAD, + Past MI and + Cardiac Stents   Rhythm:Regular Rate:Normal     Neuro/Psych negative neurological ROS     GI/Hepatic Neg liver ROS, GERD  ,  Endo/Other  diabetes  Renal/GU CRFRenal disease     Musculoskeletal   Abdominal   Peds  Hematology  (+) anemia ,   Anesthesia Other Findings   Reproductive/Obstetrics                             Lab Results  Component Value Date   WBC 8.5 06/16/2019   HGB 12.2 (L) 06/16/2019   HCT 39.0 06/16/2019   MCV 92.6 06/16/2019   PLT 207 06/16/2019   Lab Results  Component Value Date   CREATININE 1.64 (H) 06/16/2019   BUN 33 (H) 06/16/2019   NA 140 06/16/2019   K 4.4 06/16/2019   CL 106 06/16/2019   CO2 27 06/16/2019    Anesthesia Physical Anesthesia Plan  ASA: III  Anesthesia Plan: General   Post-op Pain Management:    Induction: Intravenous  PONV Risk Score and Plan: 2 and Dexamethasone, Ondansetron and Treatment may vary due to age or medical condition  Airway Management Planned: Oral ETT  Additional Equipment:   Intra-op Plan:   Post-operative Plan: Extubation in OR  Informed Consent: I have reviewed the patients History and Physical, chart, labs and discussed the procedure including the risks, benefits and alternatives for the proposed anesthesia with the patient or authorized representative who has indicated his/her understanding and acceptance.     Dental advisory given  Plan  Discussed with: CRNA  Anesthesia Plan Comments:         Anesthesia Quick Evaluation

## 2019-07-10 NOTE — Anesthesia Procedure Notes (Signed)
Arterial Line Insertion Start/End13-Mar-2021 3:45 PM, 2019/07/22 4:00 PM Performed by: Suzette Battiest, MD, anesthesiologist  Patient location: Pre-op. Preanesthetic checklist: patient identified, IV checked, site marked, risks and benefits discussed, surgical consent, monitors and equipment checked, pre-op evaluation, timeout performed and anesthesia consent Lidocaine 1% used for infiltration Right, femoral was placed Catheter size: 20 Fr Hand hygiene performed  and maximum sterile barriers used   Attempts: 1 Procedure performed using ultrasound guided technique. Ultrasound Notes:anatomy identified, needle tip was noted to be adjacent to the nerve/plexus identified, no ultrasound evidence of intravascular and/or intraneural injection and image(s) printed for medical record Following insertion, dressing applied, Biopatch and line sutured. Post procedure assessment: normal and unchanged  Patient tolerated the procedure well with no immediate complications.

## 2019-07-10 NOTE — Consult Note (Signed)
NAME:  Mark Burgess, MRN:  [id number], DOB:  1940/11/29, LOS: 0 ADMISSION DATE:  10-Jul-2019, CONSULTATION DATE:  07/10/19 REFERRING MD:  Suzette Battiest, MD CHIEF COMPLAINT:  Hemorrhagic shock  History of present illness   Mark Burgess is a 79 year old male with right renal mass admitted for nephrectomy. Intra-operatively, patient had arterial bleed resulting in hemorrhagic shock followed by PEA. CPR was started and continued for 40 minutes. The following blood products were administered during massive transfusion protocol: PRBC x10U, FFP x 5, Plt x 2, cryo x 2. He was started on neo, levo and epi gtt. Team addressed goals of care with wife and patient was DNR so chest compressions were discontinued. At that time patient's ETCO2 was >40 with thready carotid pulse. Patient was closed after bleeding was controlled. Central and arterial line placed. He was transferred to the ICU and PCCM was consulted  Past Medical History  CAD s/p stents, COPD, right renal mass  Consults:  Urology PCCM  Procedures:    Significant Diagnostic Tests:    Micro Data:    Antimicrobials:    Objective   Blood pressure 140/70, pulse 66, temperature 98 F (36.7 C), temperature source Oral, resp. rate 16, height 6\' 2"  (1.88 m), weight 92.1 kg, SpO2 96 %.    Vent Mode: PRVC FiO2 (%):  [100 %] 100 % Set Rate:  [28 bmp] 28 bmp Vt Set:  [650 mL] 650 mL PEEP:  [5 cmH20] 5 cmH20 Plateau Pressure:  [16 cmH20] 16 cmH20   Intake/Output Summary (Last 24 hours) at 10-Jul-2019 1710 Last data filed at 2019/07/10 1633 Gross per 24 hour  Intake 9106 ml  Output 5800 ml  Net 3306 ml   Filed Weights   07-10-19 1102  Weight: 92.1 kg   Physical Exam: General: Critically ill-appearing, unresponsive HENT: [place], AT, ETT in place Eyes: EOMI, no scleral icterus Respiratory: Clear to auscultation bilaterally.  No crackles, wheezing or rales Cardiovascular: RRR, -M/R/G, no JVD GI: Abdominal dressing in place  with bleeding through gauze Extremities:-Edema,-tenderness Neuro: Unresponsive, absence reflexes, fixed dilated pupils Skin: Pale, mottled  Assessment & Plan:   Renal mass s/p right nephrectomy complicated by arterial bleed with subsequent PEA arrest Hemorrhagic shock Acute hypoxemic and hypercarbic respiratory failure Metabolic acidosis Acute anoxic encephalopathy  Patient arrived to ICU in critical condition on high vasopressor requirement. ABG reviewed and demonstrated acute respiratory and metabolic acidosis. Vent settings on FIO2 100% PEEP 5 and TV 8cc/kg. RR increased from 18 to 28. Given to amps of bicarb prior to arrival to ICU. Physical exam concerning for anoxic brain injury in setting of fixed dilated pupils and absence of reflexes (cough, gag, corneal) in setting of cardiac arrest (40 minutes). Discussed goals of care with family including patient's wife. Patient was DNR prior to admission. After updating family on his critical condition and unlikelihood of meaningful recovery, I recommended withdrawing care. Wife agreed to plan. Will transition to comfort care   Code Status: DNR/Comfort Family Communication: Family discussion with wife. Transition to comfort care Disposition: Transferred to ICU  Labs   CBC: Recent Labs  Lab 07/10/19 1604 07/10/19 1607 07/10/2019 1618  HGB  --  7.5* 6.8*  HCT  --  22.0* 20.0*  PLT 50*  --   --     Basic Metabolic Panel: Recent Labs  Lab Jul 10, 2019 1607 07-10-19 1618  NA 146* 146*  K 3.6 3.6  CL 102  --   GLUCOSE 333*  --  BUN 34*  --   CREATININE 1.50*  --    GFR: Estimated Creatinine Clearance: 48.7 mL/min (A) (by C-G formula based on SCr of 1.5 mg/dL (H)). No results for input(s): PROCALCITON, WBC, LATICACIDVEN in the last 168 hours.  Liver Function Tests: No results for input(s): AST, ALT, ALKPHOS, BILITOT, PROT, ALBUMIN in the last 168 hours. No results for input(s): LIPASE, AMYLASE in the last 168 hours. No results for  input(s): AMMONIA in the last 168 hours.  ABG    Component Value Date/Time   PHART 7.001 (LL) 06/25/19 1618   PCO2ART 84.5 (HH) 2019-06-25 1618   PO2ART 319.0 (H) Jun 25, 2019 1618   HCO3 20.9 06-25-19 1618   TCO2 23 June 25, 2019 1618   ACIDBASEDEF 10.0 (H) 06-25-2019 1618   O2SAT 100.0 06/25/2019 1618     Coagulation Profile: Recent Labs  Lab 06-25-19 1604  INR 3.3*    Cardiac Enzymes: No results for input(s): CKTOTAL, CKMB, CKMBINDEX, TROPONINI in the last 168 hours.  HbA1C: Hgb A1c MFr Bld  Date/Time Value Ref Range Status  06/16/2019 12:01 PM 6.3 (H) 4.8 - 5.6 % Final    Comment:    (NOTE) Pre diabetes:          5.7%-6.4% Diabetes:              >6.4% Glycemic control for   <7.0% adults with diabetes   07/06/2017 01:47 PM 7.1 (H) 4.8 - 5.6 % Final    Comment:    (NOTE) Pre diabetes:          5.7%-6.4% Diabetes:              >6.4% Glycemic control for   <7.0% adults with diabetes     CBG: Recent Labs  Lab 2019-06-25 1037  GLUCAP 142*    Review of Systems:   Unable to obtain due to critical condition  Past Medical History  He,  has a past medical history of Aortic stenosis, CAD (coronary artery disease), Diabetes mellitus, Ejection fraction < 50%, Emphysema lung ([place]), GERD (gastroesophageal reflux disease), History of kidney stones, HTN (hypertension), and Myocardial infarction ([place]).   Surgical History    Past Surgical History:  Procedure Laterality Date  . APPENDECTOMY    . CARDIAC CATHETERIZATION    . COLONOSCOPY WITH PROPOFOL N/A 03/27/2015   Procedure: COLONOSCOPY WITH PROPOFOL;  Surgeon: Manya Silvas, MD;  Location[facility] ENDOSCOPY;  Service: Endoscopy;  Laterality: N/A;  . CORONARY STENT INTERVENTION N/A 11/30/2017   Procedure: CORONARY STENT INTERVENTION;  Surgeon: Jettie Booze, MD;  Location: [place] CV LAB;  Service: Cardiovascular;  Laterality: N/A;  . CYSTOSCOPY WITH STENT PLACEMENT Right 03/11/2017   Procedure: CYSTOSCOPY  WITH STENT PLACEMENT;  Surgeon: Franchot Gallo, MD;  Location: [facility] ORS;  Service: Urology;  Laterality: Right;  . CYSTOSCOPY WITH URETEROSCOPY AND STENT PLACEMENT Right 04/19/2017   Procedure: CYSTOSCOPY WITH URETEROSCOPY AND STENT Removal;  Surgeon: Franchot Gallo, MD;  Location: [facility] ORS;  Service: Urology;  Laterality: Right;  . CYSTOSCOPY/RETROGRADE/URETEROSCOPY/STONE EXTRACTION WITH BASKET Right 03/11/2017   Procedure: CYSTOSCOPY/RETROGRADE/URETEROSCOPY/STONE EXTRACTION WITH BASKET;  Surgeon: Franchot Gallo, MD;  Location: [facility] ORS;  Service: Urology;  Laterality: Right;  . EXTRACORPOREAL SHOCK WAVE LITHOTRIPSY Right 02/25/2017   Procedure: RIGHT EXTRACORPOREAL SHOCK WAVE LITHOTRIPSY (ESWL);  Surgeon: Cleon Gustin, MD;  Location: [facility] ORS;  Service: Urology;  Laterality: Right;  . HOLMIUM LASER APPLICATION Right [id number]   Procedure: HOLMIUM LASER APPLICATION;  Surgeon: Franchot Gallo, MD;  Location: [facility] ORS;  Service: Urology;  Laterality: Right;  . IR URETERAL STENT LEFT NEW ACCESS W/O SEP NEPHROSTOMY CATH  07/09/2017  . [place], 2006  . LEFT HEART CATH AND CORONARY ANGIOGRAPHY N/A 11/30/2017   Procedure: LEFT HEART CATH AND CORONARY ANGIOGRAPHY;  Surgeon: Jettie Booze, MD;  Location: [place] CV LAB;  Service: Cardiovascular;  Laterality: N/A;  . NEPHROLITHOTOMY Left 07/12/2017   Procedure: NEPHROLITHOTOMY PERCUTANEOUS;  Surgeon: Franchot Gallo, MD;  Location: [facility] ORS;  Service: Urology;  Laterality: Left;  . TONSILLECTOMY       Social History   reports that he quit smoking about 37 years ago. He has never used smokeless tobacco. He reports current alcohol use. He reports that he does not use drugs.   Family History   His family history includes Healthy in his mother; Heart disease in his father.   Allergies No Known Allergies   Home Medications  Prior to Admission medications   Medication Sig Start Date End Date Taking? Authorizing Provider    ADVAIR HFA 115-21 MCG/ACT inhaler Inhale 2 puffs into the lungs 2 (two) times daily.  06/14/16  Yes [provider]  allopurinol (ZYLOPRIM) 300 MG tablet Take 300 mg by mouth daily.    Yes [provider]  amLODipine (NORVASC) 2.5 MG tablet Take 1 tablet (2.5 mg total) by mouth daily. 12/21/18  Yes Minus Breeding, MD  aspirin EC 81 MG tablet Take 81 mg by mouth daily.   Yes [provider]  carvedilol (COREG) 12.5 MG tablet Take 1 tablet (12.5 mg total) by mouth 2 (two) times daily. 06/22/11  Yes Minus Breeding, MD  clopidogrel (PLAVIX) 75 MG tablet TAKE 1 TABLET (75 MG TOTAL) BY MOUTH DAILY WITH BREAKFAST. 04/27/19  Yes Minus Breeding, MD  finasteride (PROSCAR) 5 MG tablet Take 5 mg by mouth daily.    Yes [provider]  latanoprost (XALATAN) 0.005 % ophthalmic solution Place 1 drop into both eyes at bedtime.  12/29/15  Yes [provider]  lisinopril-hydrochlorothiazide (PRINZIDE,ZESTORETIC) 20-12.5 MG per tablet Take 1 tablet by mouth daily.    Yes [provider]  metFORMIN (GLUCOPHAGE-XR) 500 MG 24 hr tablet Take 500 mg by mouth 2 (two) times daily.  12/13/16  Yes [provider]  Multiple Vitamins-Minerals (CENTRUM MEN PO) Take 1 tablet by mouth daily.    Yes [provider]  nitroGLYCERIN (NITROSTAT) 0.4 MG SL tablet PLACE 1 TABLET (0.4 MG TOTAL) UNDER THE TONGUE EVERY 5 (FIVE) MINUTES AS NEEDED. Patient taking differently: Place 0.4 mg under the tongue every 5 (five) minutes as needed for chest pain.  07/06/17  Yes Minus Breeding, MD  pantoprazole (PROTONIX) 40 MG tablet TAKE 1 TABLET BY MOUTH EVERY DAY Patient taking differently: Take 40 mg by mouth daily.  01/12/19  Yes Minus Breeding, MD  rosuvastatin (CRESTOR) 40 MG tablet Take 1 tablet (40 mg total) by mouth daily. 10/20/17 06/12/19 Yes Minus Breeding, MD  spironolactone (ALDACTONE) 25 MG tablet Take 25 mg by mouth daily.    Yes [provider]     Critical  care time: 50 min    IThe patient is critically ill with multiple organ systems failure and requires high complexity decision making for assessment and support, frequent evaluation and titration of therapies, application of advanced monitoring technologies and extensive interpretation of multiple databases.   Rodman Pickle, M.D. [facility] Pulmonary/Critical Care Medicine 11-Jul-2019 5:10 PM

## 2019-07-10 NOTE — Progress Notes (Signed)
MD Link Snuffer notified of patient time of death 15.

## 2019-07-10 NOTE — Anesthesia Procedure Notes (Deleted)
Arterial Line Insertion Start/End03/04/2020 3:45 PM, Jul 21, 2019 3:52 PM Performed by: Suzette Battiest, MD, anesthesiologist  Patient location: OR. Preanesthetic checklist: patient identified, IV checked, site marked, risks and benefits discussed, surgical consent, monitors and equipment checked, pre-op evaluation, timeout performed and anesthesia consent Patient sedated femoral was placed Catheter size: 20 G Maximum sterile barriers used   Attempts: 1 Procedure performed using ultrasound guided technique. Ultrasound Notes:anatomy identified, needle tip was noted to be adjacent to the nerve/plexus identified, no ultrasound evidence of intravascular and/or intraneural injection and image(s) printed for medical record Following insertion, line sutured, dressing applied and Biopatch. Patient tolerated the procedure well with no immediate complications.

## 2019-07-10 NOTE — Progress Notes (Signed)
Spiritual Care Visit  Name: Mark Apa "[name]"  Time: 5:00pm-6:30pm  Location: ICU [place] of Visit: End of Life  Pt Status: Relaxed Facial Expression / Non-Responsive  Summary of Visit: Chaplain provided emotional and spiritual support via prayer. Chaplain at bedside when family made decision to move to comfort care. Chaplain also at bedside during passing.   Intervention Chaplain celebrated with family as they expressed their faith beliefs.  Overall, chaplain provided a silent and supportive presence.   [place]

## 2019-07-10 NOTE — Anesthesia Procedure Notes (Signed)
Central Venous Catheter Insertion Performed by: Suzette Battiest, MD, anesthesiologist Start/End02/15/21 4:00 PM, 2019/06/26 4:15 PM Patient location: Pre-op. Preanesthetic checklist: patient identified, IV checked, site marked, risks and benefits discussed, surgical consent, monitors and equipment checked, pre-op evaluation, timeout performed and anesthesia consent Position: Trendelenburg Lidocaine 1% used for infiltration and patient sedated Hand hygiene performed , maximum sterile barriers used  and Seldinger technique used Catheter size: 8 Fr Total catheter length 16. Central line was placed.Double lumen Procedure performed using ultrasound guided technique. Ultrasound Notes:anatomy identified, needle tip was noted to be adjacent to the nerve/plexus identified, no ultrasound evidence of intravascular and/or intraneural injection and image(s) printed for medical record Attempts: 1 Following insertion, dressing applied, line sutured and Biopatch. Post procedure assessment: blood return through all ports  Patient tolerated the procedure well with no immediate complications.

## 2019-07-10 DEATH — deceased

## 2019-11-19 IMAGING — CT CT CHEST WITHOUT CONTRAST
1 series · 15 of 34 positions shown, 19 images · non-contrast
Comparison: 07/19/2018

CLINICAL DATA: Lung nodule.

EXAM:
CT CHEST WITHOUT CONTRAST
TECHNIQUE: Multidetector CT imaging of the chest was performed following the
standard protocol without IV contrast.

[Series 3: thorax · axial · 0.78mm/px · z∈[-574,-312]mm · 15 of 155 slices shown, 19 images]
[im 12/155  mediastinal]
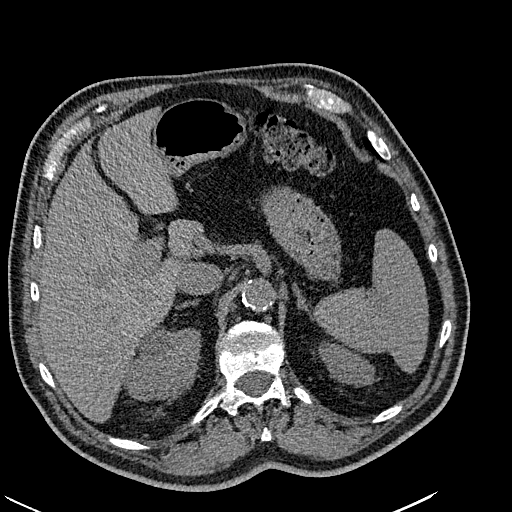
[im 12/155  lung]
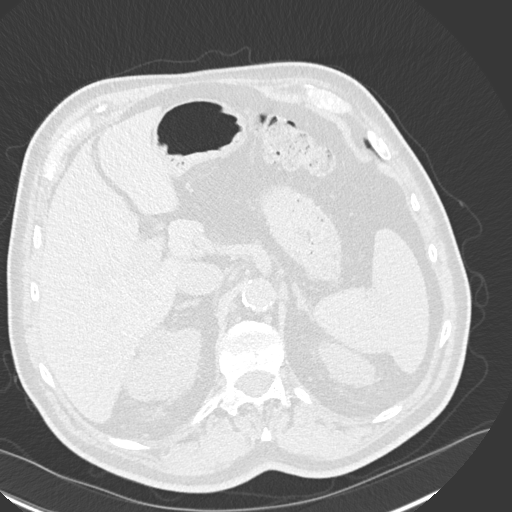
[im 23/155  lung]
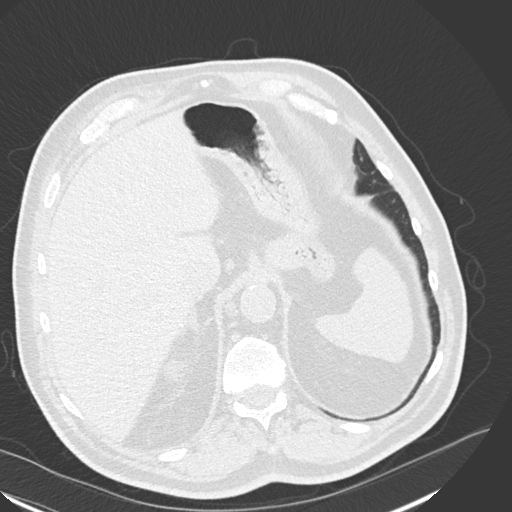
[im 31/155  lung]
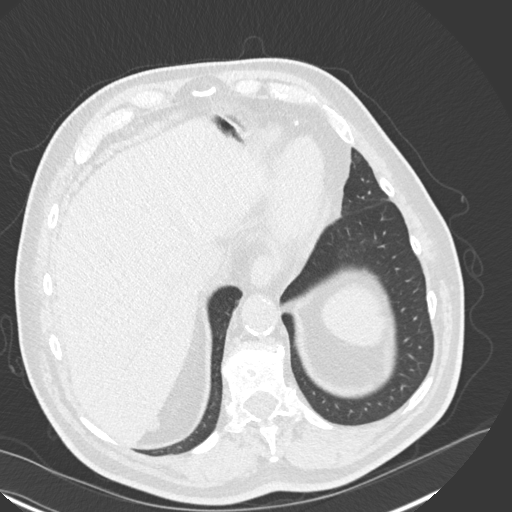
[im 40/155  lung]
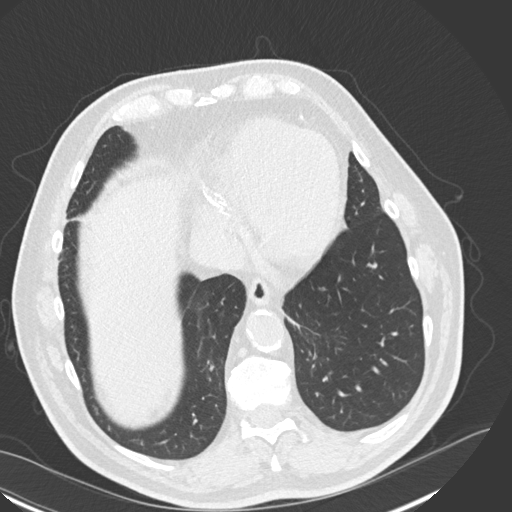
[im 52/155  mediastinal]
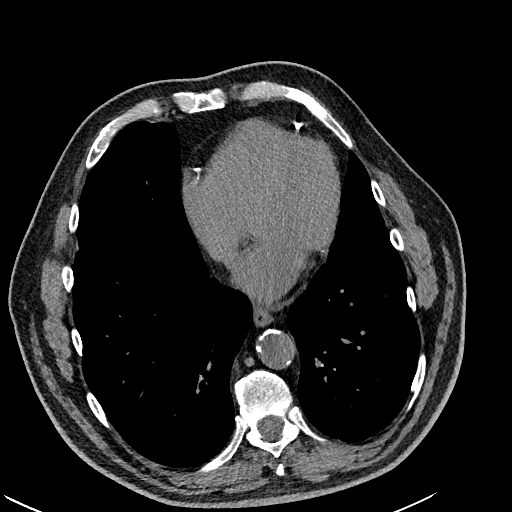
[im 52/155  lung]
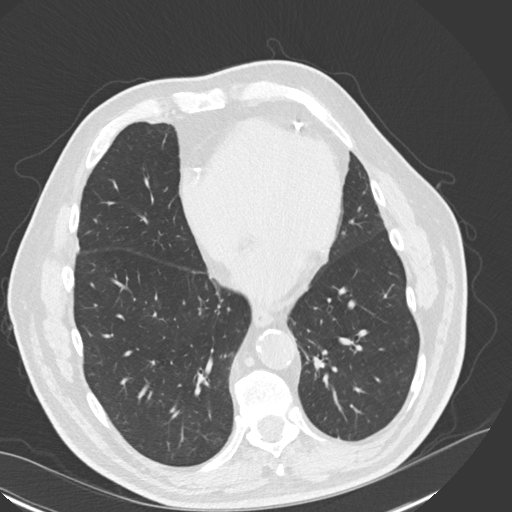
[im 62/155  lung]
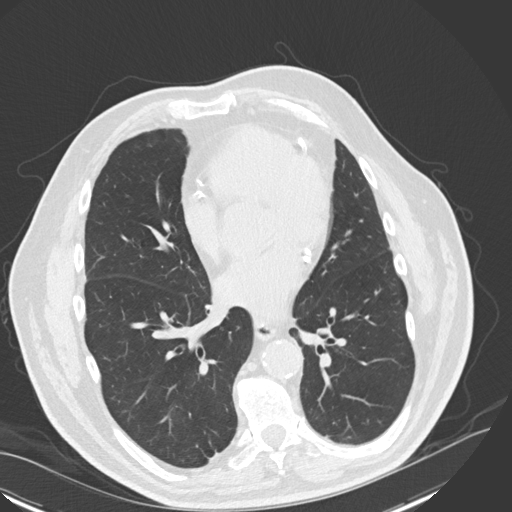
[im 69/155  lung]
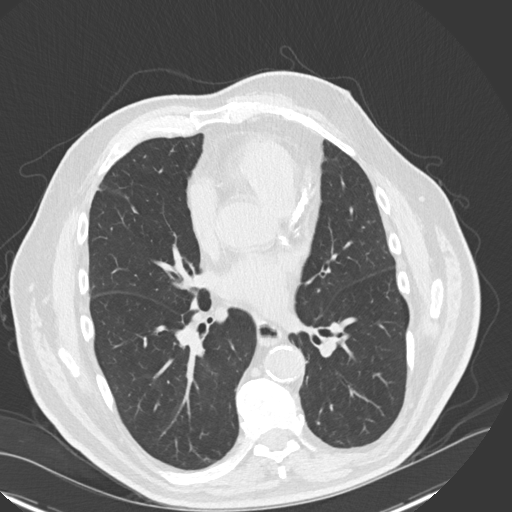
[im 80/155  lung]
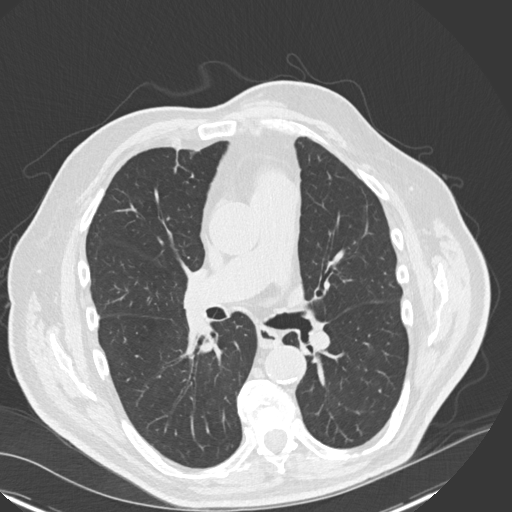
[im 86/155  mediastinal]
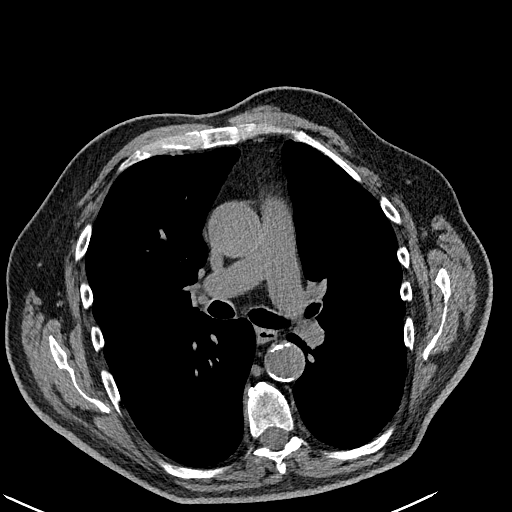
[im 86/155  lung]
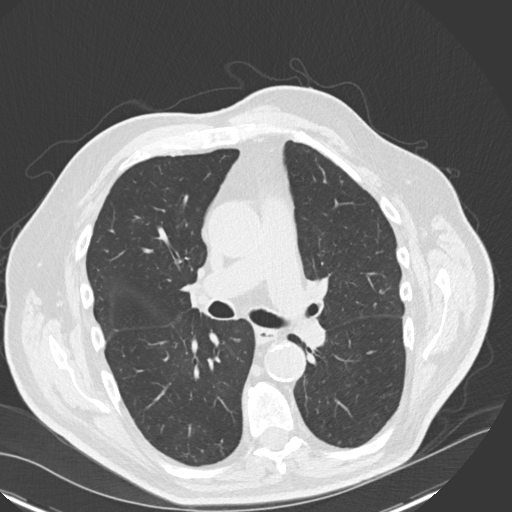
[im 93/155  lung]
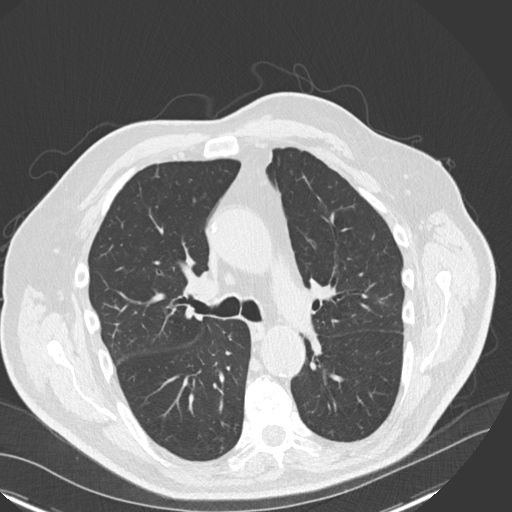
[im 103/155  lung]
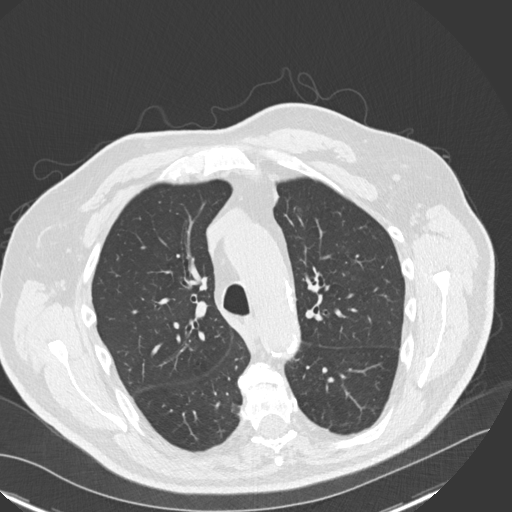
[im 115/155  lung]
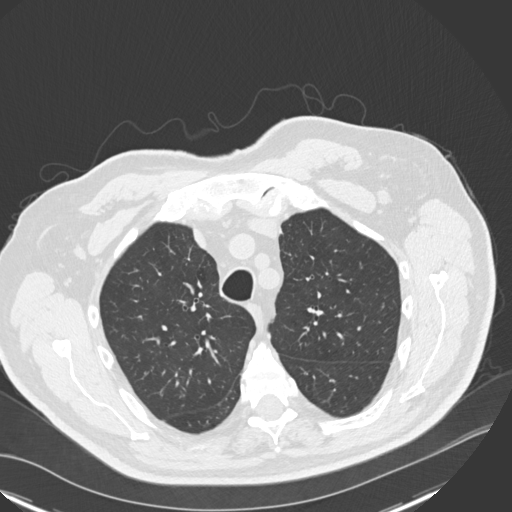
[im 124/155  mediastinal]
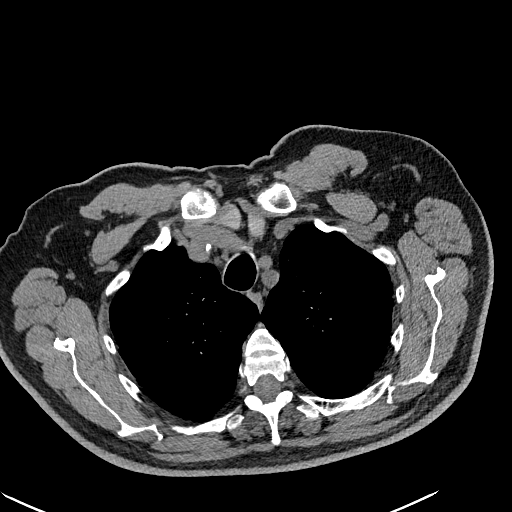
[im 124/155  lung]
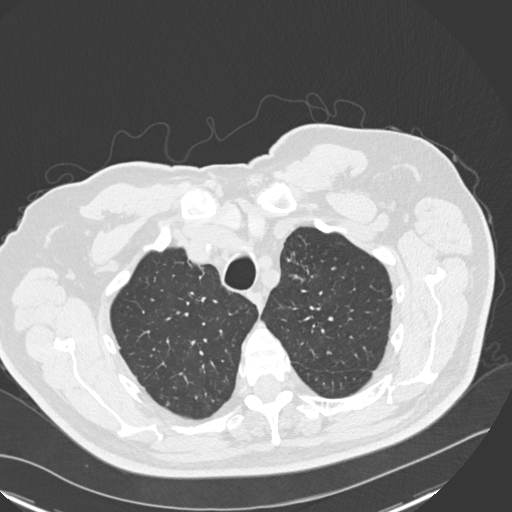
[im 132/155  lung]
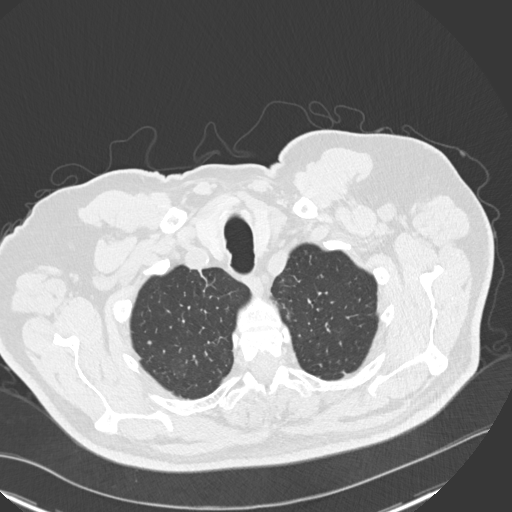
[im 143/155  lung]
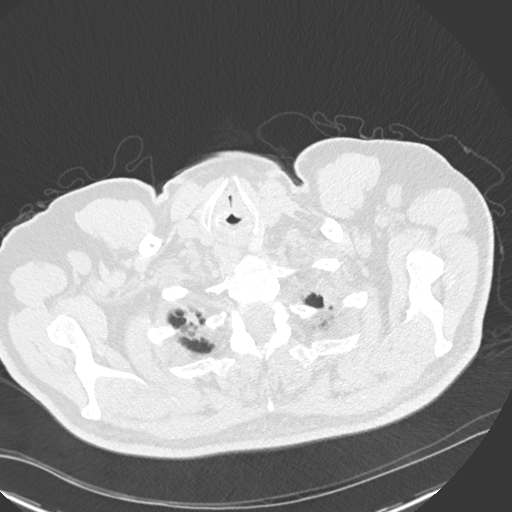

[15 of 34 positions shown; findings below may reference images not displayed]

FINDINGS: Cardiovascular: The heart size is normal. No substantial pericardial
effusion. Coronary artery calcification is evident. Atherosclerotic
calcification is noted in the wall of the thoracic aorta.

Mediastinum/Nodes: No mediastinal lymphadenopathy. No evidence for
gross hilar lymphadenopathy although assessment is limited by the
lack of intravenous contrast on today's study. The esophagus has
normal imaging features. There is no axillary lymphadenopathy.

Lungs/Pleura: The central tracheobronchial airways are patent. The 6
mm nodule in the medial left upper lobe on the previous study is 5
mm today. 8 mm left lower lobe nodule (109/2) is unchanged. Stable 8
mm subpleural nodule posterior left lower lobe (97/2). Small nodules
along the minor fissure are unchanged. New area of subtle left
tree-in-bud lower lobe (nodularity noted 56/2). Multiple other
scattered tiny bilateral pulmonary nodules are similar. No pleural
effusion.

Upper Abdomen: 3 cm lesion in the upper pole right kidney approaches
water attenuation and is likely a cyst.

Musculoskeletal: No worrisome lytic or sclerotic osseous
abnormality.
IMPRESSION: 1. Interval stability of the multiple bilateral pulmonary nodules in
the 3 month interval since prior study. Given that these nodules
were enlarging on the previous study, continued close surveillance
recommended. Repeat CT chest in 6-12 months recommended to ensure
stability.
2. New subtle focus of tree-in-bud nodularity anterior left lower
lobe compatible with atypical infection.
3.  Aortic Atherosclerois ([id number])

## 2019-12-06 ENCOUNTER — Ambulatory Visit: Payer: Medicare HMO | Admitting: Oncology

## 2020-07-23 IMAGING — DX DG CHEST 1V PORT
1 series · 1 of 1 positions shown · non-contrast
Comparison: 05/23/2019, 03/24/2019

CLINICAL DATA: Right renal cell carcinoma status post right
nephrectomy, intubated

EXAM:
PORTABLE CHEST 1 VIEW

[chest ap]
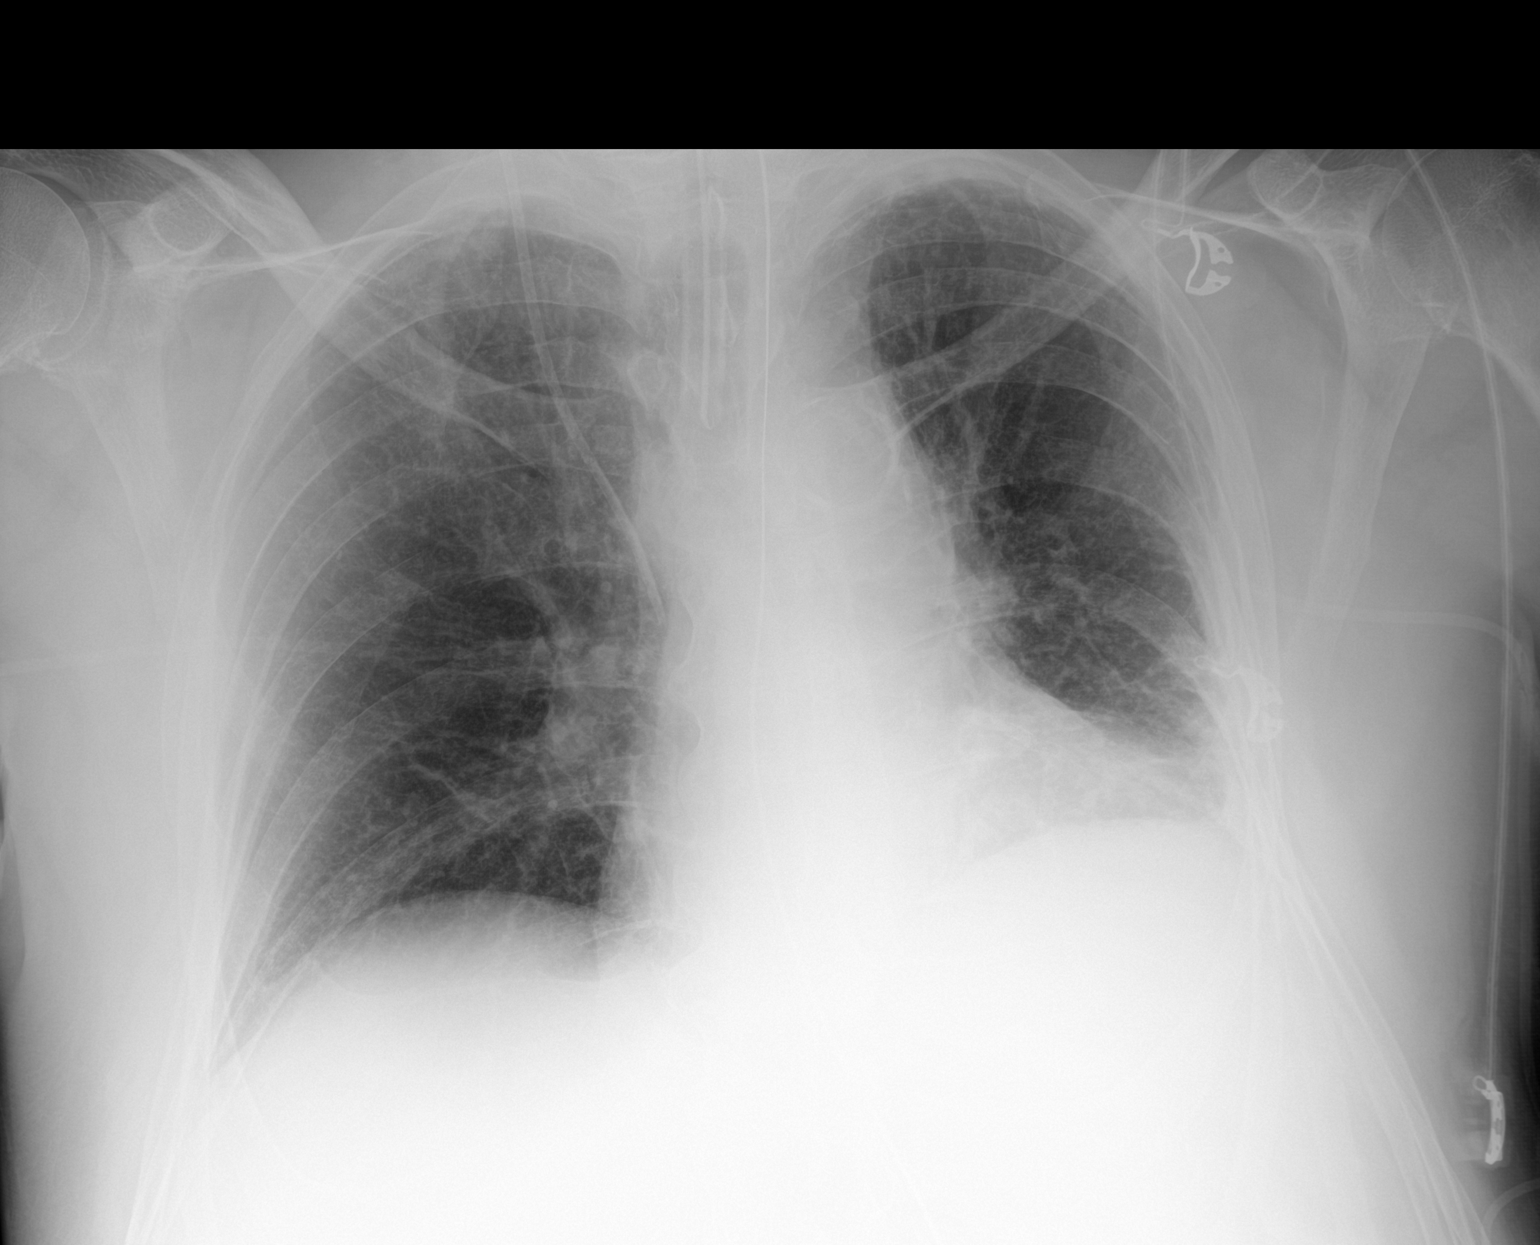

[1 of 1 positions shown; findings below may reference images not displayed]

FINDINGS: Single frontal view of the chest demonstrates endotracheal tube
overlying tracheal air column tip at level of thoracic inlet.
Enteric catheter passes below diaphragm tip excluded by collimation.
There is a right internal jugular central venous catheter tip
overlying superior vena cava. Mild diffuse interstitial prominence
may reflect interstitial edema. Likely hypoventilatory changes at
the left lung base. No airspace disease, effusion, or pneumothorax.
IMPRESSION: 1. Support devices as above.
2. Mild interstitial edema.
